# Patient Record
Sex: Female | Born: 1960 | Race: White | Hispanic: No | Marital: Married | Smoking: Former smoker
Health system: Southern US, Community
[De-identification: ages and names within clinical notes are randomized; demographics above are authoritative.]

## PROBLEM LIST (undated history)

## (undated) DIAGNOSIS — F419 Anxiety disorder, unspecified: Secondary | ICD-10-CM

## (undated) DIAGNOSIS — F32A Depression, unspecified: Secondary | ICD-10-CM

## (undated) DIAGNOSIS — F329 Major depressive disorder, single episode, unspecified: Secondary | ICD-10-CM

## (undated) DIAGNOSIS — F319 Bipolar disorder, unspecified: Secondary | ICD-10-CM

## (undated) HISTORY — PX: TONSILLECTOMY: SUR1361

---

## 2007-09-04 ENCOUNTER — Ambulatory Visit (HOSPITAL_COMMUNITY): Admission: RE | Admit: 2007-09-04 | Discharge: 2007-09-04 | Payer: Self-pay | Admitting: Emergency Medicine

## 2009-05-03 ENCOUNTER — Other Ambulatory Visit: Admission: RE | Admit: 2009-05-03 | Discharge: 2009-05-03 | Payer: Self-pay | Admitting: Family Medicine

## 2009-08-19 ENCOUNTER — Encounter: Admission: RE | Admit: 2009-08-19 | Discharge: 2009-08-19 | Payer: Self-pay | Admitting: Family Medicine

## 2010-08-20 ENCOUNTER — Encounter
Admission: RE | Admit: 2010-08-20 | Discharge: 2010-08-20 | Payer: Self-pay | Source: Home / Self Care | Attending: Family Medicine | Admitting: Family Medicine

## 2012-04-18 ENCOUNTER — Other Ambulatory Visit: Payer: Self-pay | Admitting: Family Medicine

## 2012-04-18 DIAGNOSIS — Z1231 Encounter for screening mammogram for malignant neoplasm of breast: Secondary | ICD-10-CM

## 2012-05-02 ENCOUNTER — Ambulatory Visit
Admission: RE | Admit: 2012-05-02 | Discharge: 2012-05-02 | Disposition: A | Discharge status: Home / Self Care | Payer: Managed Care, Other (non HMO) | Source: Ambulatory Visit | Attending: Family Medicine | Admitting: Family Medicine

## 2012-05-02 DIAGNOSIS — Z1231 Encounter for screening mammogram for malignant neoplasm of breast: Secondary | ICD-10-CM

## 2013-05-18 ENCOUNTER — Other Ambulatory Visit: Payer: Self-pay

## 2013-05-18 DIAGNOSIS — Z1231 Encounter for screening mammogram for malignant neoplasm of breast: Secondary | ICD-10-CM

## 2013-06-05 ENCOUNTER — Ambulatory Visit
Admission: RE | Admit: 2013-06-05 | Discharge: 2013-06-05 | Disposition: A | Discharge status: Home / Self Care | Payer: Self-pay | Source: Ambulatory Visit

## 2013-06-05 DIAGNOSIS — Z1231 Encounter for screening mammogram for malignant neoplasm of breast: Secondary | ICD-10-CM

## 2013-08-25 ENCOUNTER — Emergency Department (HOSPITAL_COMMUNITY)
Admission: EM | Admit: 2013-08-25 | Discharge: 2013-08-25 | Disposition: A | Discharge status: Home / Self Care | Payer: BC Managed Care – PPO | Attending: Emergency Medicine | Admitting: Emergency Medicine

## 2013-08-25 ENCOUNTER — Encounter (HOSPITAL_COMMUNITY): Payer: Self-pay | Admitting: Emergency Medicine

## 2013-08-25 ENCOUNTER — Emergency Department (HOSPITAL_COMMUNITY): Payer: BC Managed Care – PPO

## 2013-08-25 DIAGNOSIS — R0789 Other chest pain: Secondary | ICD-10-CM | POA: Insufficient documentation

## 2013-08-25 DIAGNOSIS — R110 Nausea: Secondary | ICD-10-CM | POA: Insufficient documentation

## 2013-08-25 DIAGNOSIS — Z79899 Other long term (current) drug therapy: Secondary | ICD-10-CM | POA: Insufficient documentation

## 2013-08-25 DIAGNOSIS — F319 Bipolar disorder, unspecified: Secondary | ICD-10-CM | POA: Insufficient documentation

## 2013-08-25 DIAGNOSIS — J3489 Other specified disorders of nose and nasal sinuses: Secondary | ICD-10-CM | POA: Insufficient documentation

## 2013-08-25 DIAGNOSIS — R079 Chest pain, unspecified: Secondary | ICD-10-CM

## 2013-08-25 DIAGNOSIS — F411 Generalized anxiety disorder: Secondary | ICD-10-CM | POA: Insufficient documentation

## 2013-08-25 DIAGNOSIS — R1013 Epigastric pain: Secondary | ICD-10-CM | POA: Insufficient documentation

## 2013-08-25 DIAGNOSIS — Z87891 Personal history of nicotine dependence: Secondary | ICD-10-CM | POA: Insufficient documentation

## 2013-08-25 HISTORY — DX: Depression, unspecified: F32.A

## 2013-08-25 HISTORY — DX: Bipolar disorder, unspecified: F31.9

## 2013-08-25 HISTORY — DX: Anxiety disorder, unspecified: F41.9

## 2013-08-25 HISTORY — DX: Major depressive disorder, single episode, unspecified: F32.9

## 2013-08-25 LAB — CBC
HEMATOCRIT: 38 % (ref 36.0–46.0)
HEMOGLOBIN: 12.9 g/dL (ref 12.0–15.0)
MCH: 31.7 pg (ref 26.0–34.0)
MCHC: 33.9 g/dL (ref 30.0–36.0)
MCV: 93.4 fL (ref 78.0–100.0)
Platelets: 254 10*3/uL (ref 150–400)
RBC: 4.07 MIL/uL (ref 3.87–5.11)
RDW: 12.6 % (ref 11.5–15.5)
WBC: 9.5 10*3/uL (ref 4.0–10.5)

## 2013-08-25 LAB — POCT I-STAT TROPONIN I
TROPONIN I, POC: 0.03 ng/mL (ref 0.00–0.08)
Troponin i, poc: 0 ng/mL (ref 0.00–0.08)

## 2013-08-25 LAB — BASIC METABOLIC PANEL
BUN: 12 mg/dL (ref 6–23)
CALCIUM: 9.7 mg/dL (ref 8.4–10.5)
CHLORIDE: 99 meq/L (ref 96–112)
CO2: 31 mEq/L (ref 19–32)
Creatinine, Ser: 0.85 mg/dL (ref 0.50–1.10)
GFR calc non Af Amer: 79 mL/min — ABNORMAL LOW (ref >90)
GLUCOSE: 112 mg/dL — AB (ref 70–99)
Potassium: 4.6 mEq/L (ref 3.7–5.3)
SODIUM: 138 meq/L (ref 137–147)

## 2013-08-25 MED ORDER — HYDROCODONE-ACETAMINOPHEN 5-325 MG PO TABS: ORAL_TABLET | ORAL | Status: DC

## 2013-08-25 MED ORDER — OMEPRAZOLE 20 MG PO CPDR: DELAYED_RELEASE_CAPSULE | ORAL | Status: DC

## 2013-08-25 NOTE — Discharge Instructions (Signed)
Please read and follow all provided instructions.  Your diagnoses today include:  1. Chest pain     Tests performed today include:  An EKG of your heart  A chest x-ray  Cardiac enzymes - a blood test for heart muscle damage  Blood counts and electrolytes  Vital signs. See below for your results today.   Medications prescribed:   Vicodin (hydrocodone/acetaminophen) - narcotic pain medication  DO NOT drive or perform any activities that require you to be awake and alert because this medicine can make you drowsy. BE VERY CAREFUL not to take multiple medicines containing Tylenol (also called acetaminophen). Doing so can lead to an overdose which can damage your liver and cause liver failure and possibly death.   Omeprazole (Prilosec) - stomach acid reducer  This medication can be found over-the-counter  Take any prescribed medications only as directed.  Follow-up instructions: Please follow-up with your primary care provider as soon as you can for further evaluation of your symptoms. If you do not have a primary care doctor -- see below for referral information.   Return instructions:  SEEK IMMEDIATE MEDICAL ATTENTION IF:  You have severe chest pain, especially if the pain is crushing or pressure-like and spreads to the arms, back, neck, or jaw, or if you have sweating, nausea (feeling sick to your stomach), or shortness of breath. THIS IS AN EMERGENCY. Don't wait to see if the pain will go away. Get medical help at once. Call 911 or 0 (operator). DO NOT drive yourself to the hospital.   Your chest pain gets worse and does not go away with rest.   You have an attack of chest pain lasting longer than usual, despite rest and treatment with the medications your caregiver has prescribed.   You wake from sleep with chest pain or shortness of breath.  You feel dizzy or faint.  You have chest pain not typical of your usual pain for which you originally saw your caregiver.   You  have any other emergent concerns regarding your health.  Additional Information: Chest pain comes from many different causes. Your caregiver has diagnosed you as having chest pain that is not specific for one problem, but does not require admission.  You are at low risk for an acute heart condition or other serious illness.   Your vital signs today were: BP 123/65   Pulse 69   Temp(Src) 98.1 F (36.7 C) (Oral)   Resp 15   Ht 5\' 7"  (1.702 m)   Wt 138 lb (62.596 kg)   BMI 21.61 kg/m2   SpO2 98%   LMP 06/25/2013 If your blood pressure (BP) was elevated above 135/85 this visit, please have this repeated by your doctor within one month. --------------  Emergency Department Resource Guide 1) Find a Doctor and Pay Out of Pocket Although you won't have to find out who is covered by your insurance plan, it is a good idea to ask around and get recommendations. You will then need to call the office and see if the doctor you have chosen will accept you as a new patient and what types of options they offer for patients who are self-pay. Some doctors offer discounts or will set up payment plans for their patients who do not have insurance, but you will need to ask so you aren't surprised when you get to your appointment.  2) Contact Your Local Health Department Not all health departments have doctors that can see patients for sick visits, but  many do, so it is worth a call to see if yours does. If you don't know where your local health department is, you can check in your phone book. The CDC also has a tool to help you locate your state's health department, and many state websites also have listings of all of their local health departments.  3) Find a Walk-in Clinic If your illness is not likely to be very severe or complicated, you may want to try a walk in clinic. These are popping up all over the country in pharmacies, drugstores, and shopping centers. They're usually staffed by nurse practitioners or  physician assistants that have been trained to treat common illnesses and complaints. They're usually fairly quick and inexpensive. However, if you have serious medical issues or chronic medical problems, these are probably not your best option.  No Primary Care Doctor: - Call Health Connect at  [phone] - they can help you locate a primary care doctor that  accepts your insurance, provides certain services, etc. - Physician Referral Service- [phone]  Chronic Pain Problems: Organization         Address  Phone   Notes  [facility]  [phone] Patients need to be referred by their primary care doctor.   Medication Assistance: Organization         Address  Phone   Notes  [facility] Medication [facility] [address] [place] [place], [place] [address] [phone] --Must be a resident of [facility] -- Must have NO insurance coverage whatsoever (no Medicaid/ Medicare, etc.) -- The pt. MUST have a primary care doctor that directs their care regularly and follows them in the community   [facility]  [phone]   [employer]  [phone]    Agencies that provide inexpensive medical care: Organization         Address  Phone   Notes  [facility]  [phone]   [facility]    [phone]   [facility] [address] [place], [place] [address] [phone]   [facility] [address] [place] [phone]   Planned Parenthood    [phone]   [facility]    [phone]   [facility]  [address], [place] Phone:  [phone], Fax:  [phone] Hours of Operation:  9 am - 6 pm, M-F.  Also accepts Medicaid/Medicare and self-pay.  [facility]  [address], [place] Phone: [phone], Fax: [phone]. Hours of Operation:  8:30 am - 5:30 pm, M-F.   Also accepts Medicaid and self-pay.  [facility] [place] [address], [place] Phone: [phone]   [facility] [address] Natasha Bence [place], [place] [phone], Ext. [phone] Mondays & Thursdays: 7-9 AM.  First 15 patients are seen on a first come, first serve basis.    Medicaid-accepting [facility] Providers:  Organization         Address  Phone   Notes  [facility] [address], [place] [phone] Also accepts self-pay patients.  [facility] [address] Laurell Josephs [place], [place]  [phone]   [facility] [address], [place] [phone]   [facility] [address] [place] [phone]   Adrian Saran  Bland [address], [place]   [phone] Only accepts [place] patients after they have their name applied to their card.   Self-Pay (no insurance) in [facility]:  Organization         Address  Phone   Notes  Sickle Cell Patients, [facility] Internal Medicine [address] [place], [place] [phone]   [facility] [address] [place], [place] [phone]   [facility] [place]  [address], [place] [phone]   [facility]/Dr. Osei-Bonsu  [address] [place] or [address], [place] [phone] Phone number for both [place] and [place] locations is the same.  [facility] [address] [place] [phone]   [facility] [address] [place] or [address] [phone]   [facility] [address] [place] [phone], phone; [phone], fax Sees patients 1st and 3rd Saturday of every month.  Must not qualify for public or private insurance (i.e. Medicaid, Medicare, [place] Health Choice, Veterans'  Benefits)  Household income should be no more than 200% of the poverty level The clinic cannot treat you if you are pregnant or think you are pregnant  Sexually transmitted diseases are not treated at the clinic.    Dental Care: Organization         Address  Phone  Notes  [facility] Department of [facility] [address] [place], [place] [phone] Accepts children up to age 4 who are enrolled in [place] or [place] Health Choice; pregnant women with a Medicaid card; and children who have applied for Medicaid or [place] Health Choice, but were declined, whose parents can pay a reduced fee at time of service.  [facility] Department of [facility]  [address], [place] [phone] Accepts children up to age 43 who are enrolled in [place] or [place] Health Choice; pregnant women with a Medicaid card; and children who have applied for Medicaid or [place] Health Choice, but were declined, whose parents can pay a reduced fee at time of service.  [facility]  [address] [place], [place] [phone] Patients are seen by appointment only. Walk-ins are not accepted. [facility] will see patients 81 years of age and older. Monday - Tuesday (8am-5pm) Most Wednesdays (8:30-5pm) $30 per visit, cash only  [facility] Adult Dental Access [facility]  [address], [facility] [phone] Patients are seen by appointment only. Walk-ins are not accepted. [facility] will see patients 85 years of age and older. One Wednesday Evening (Monthly: Volunteer Based).  $30 per visit, cash only  [employer]  [phone] for adults; Children under age 44, call Graduate Pediatric Dentistry at [phone]. Children aged 78-14, please call [phone] to request a pediatric application.  Dental services are provided in all areas of dental care including fillings, crowns and bridges, complete and partial  dentures, implants, gum treatment, root canals, and extractions. Preventive care is also provided. Treatment is provided to both adults and children. Patients are selected via a lottery and there is often a waiting list.   [facility] [address] [place]  [phone] [email or web address]   [facility] [address] [place], [place] [phone], Ext. [phone]  Second and Fourth Thursday of each month, opens at 6:30 AM; Clinic ends at 9 AM.  Patients are seen on a first-come first-served basis, and a limited number are seen during each clinic.   [facility]  [address], [place], [place] [phone]   Eligibility Requirements You must have lived in [place], [place], or [place] counties for at least the last three months.   You cannot be eligible for state or federal sponsored [place] Cityhealthcare insurance, including [employer]IG[employer]eterans Administration, [place]icaid, or [employer]icare.   You generally cannot be eligible for healthcare insurance through your employer.    How to apply: Eligibility screenings are held every Tuesday and Wednesday afternoon from 1:00 pm until 4:00 pm. You do not need an appointment for the interview!  [facility] [address], [place], [place] [phone]   [facility]  [phone]   [facility]  [phone]   [facility]  [phone]    Behavioral Health Resources in the Community: Intensive Outpatient Programs Organization         Address  Phone  Notes  [facility] [address], [place], [place] [phone]   [facility] [address], [place], [place] [phone]   [facility] [address], [place], [place]  [phone]   [facility] [address],  [place], [place] [phone] or [phone]   Substance Abuse Resources Organization          Address  Phone  Notes  [facility]  [phone]   [facility]  [phone]   [facility]  [phone]   [facility]  [phone]   Residential & Outpatient Substance Abuse Program  [phone]   Psychological Services Organization         Address  Phone  Notes  [facility]  [phone]- [phone]   [facility]  [phone]- [phone]   [facility] [address], [place] ([phone] or [phone]    Mobile Crisis Teams Organization         Address  Phone  Notes  [facility], Mobile Crisis Care Unit  [phone]   [facility]  [address] [place], [place] [phone]   Doristine LocksSharon DeEsch [address] [place] [phone]    Self-Help/Support Groups Organization         Address  Phone             Notes  [facility] [place] - variety of support groups  336- [phone] Call for more information  Narcotics Anonymous (NA), [facility] [address], [place]  2 meetings at this location   Statisticianesidential Treatment Programs Organization         Address  Phone  Notes  [facility] [address],    [place]  [phone]   [facility]  [address], [place] [phone], [place], [place] [phone]   [facility] [address], [place] [phone] Admissions: 8am-3pm M-F  [facility] [address]    [place], [place] [phone]   [facility] [address], [place], [place] [phone]   [facility] [address]  [place], [place] [phone]   [facility] - Intensive Outpatient [address], [place], [place] [phone]   [facility] ([facility] [phone]  [employer]  [place], [place] [phone] or [phone]   [facility] ([facility]) [address] [place], [place]  [phone] Accepts Medicaid  [facility] [address],  [place] [phone] Substance Abuse/Addiction Treatment   [facility] Organization         Address  Phone  Notes  [facility]  [phone]   Angie Fava, PhD [address] Ervin Knack [place], [place]   [phone] or [phone]   [facility]   [address] [place], [place] [phone]   [facility] [address] [place], [place] [phone] Insurance/Medicaid/sponsorship through [facility] and Families [address]                                    [place], [place] [phone] Therapy/tele-psych/case  [facility] [address], [place] [phone]    Dr. Lolly Mustache  [phone]   [facility]  [employer] [facility] Dept. 1) [address] [place] 2) [address], [place] 3)  [address], [place] [phone]  [phone]   [facility] Child Abuse Hotline 718-681-6943 or [phone] (After Hours)

## 2013-08-25 NOTE — ED Notes (Signed)
Per EMS - pt had onset of CP at 7am while on the way to work. Had nausea and lightheadedness along with it. Pt took tums, didn't feel better. Upon ems arrival, still felt lightheaded. EMS administered 324 ASA, 4 MG of Zofran, and 1 Nitro. Pt had relief with meds. 20 G in left arm. Initial BP 158/92, after nitro 138/78. Hx of bipolar, anxiety and depession, reports she does take meds for them. HR 78-80s. 98% on room air. RR 22. Takes Lamictal, valium and Lithium. Allergies to codeine. 12 lead unremarkable.

## 2013-08-25 NOTE — ED Notes (Signed)
Phlebotomy at bedside.

## 2013-08-25 NOTE — ED Notes (Signed)
Pt reports she was on her way to work when she started to have some sub-sternal CP, the pain increasingly became worse, started to eat then while at work the pain became worse. Felt like she was going to pass out and became nauseous. Pt reports she became very weak and lightheaded so she called EMS.

## 2013-08-25 NOTE — ED Notes (Signed)
Pt returned from radiology.

## 2013-08-25 NOTE — ED Provider Notes (Signed)
CSN: [id number]     Arrival date & time 08/25/13  0850 History   First MD Initiated Contact with Patient 08/25/13 [phone]     Chief Complaint  Patient presents with  . Chest Pain   (Consider location/radiation/quality/duration/timing/severity/associated sxs/prior Treatment) HPI Comments: Patient with history of bipolar disorder presents with complaint of lower chest and epigastric pain which began gradually approximately 7 AM and lasted for about an hour and half. Patient gradually became more severe. She describes it as being "punched in the chest". She did not have any radiation of pain, associated shortness of breath, diaphoresis. She had nausea but no vomiting. She took comments which helped her pain. She was given nitroglycerin, Zofran and aspirin by EMS which also helped. Pain is mild currently. Patient denies history of hypertension, diabetes, hypercholesterolemia, smoking, family history of CAD. She does not take aspirin.  Patient is a 53 y.o. female presenting with chest pain. The history is provided by the patient.  Chest Pain Associated symptoms: abdominal pain   Associated symptoms: no back pain, no cough, no diaphoresis, no fever, no nausea, no palpitations, no shortness of breath and not vomiting     Past Medical History  Diagnosis Date  . Bipolar 1 disorder   . Anxiety   . Depression    Past Surgical History  Procedure Laterality Date  . Tonsillectomy     No family history on file. History  Substance Use Topics  . Smoking status: Former Games developermoker  . Smokeless tobacco: Not on file  . Alcohol Use: Yes     Comment: 4-5 drink about 4-5 times a week   OB History   Grav Para Term Preterm Abortions TAB SAB Ect Mult Living                 Review of Systems  Constitutional: Negative for fever and diaphoresis.  HENT: Positive for congestion and sinus pressure.   Eyes: Negative for redness.  Respiratory: Negative for cough and shortness of breath.   Cardiovascular: Positive  for chest pain. Negative for palpitations and leg swelling.  Gastrointestinal: Positive for abdominal pain. Negative for nausea and vomiting.  Genitourinary: Negative for dysuria.  Musculoskeletal: Negative for back pain and neck pain.  Skin: Negative for rash.  Neurological: Negative for syncope and light-headedness.    Allergies  Codeine  Home Medications   Current Outpatient Rx  Name  Route  Sig  Dispense  Refill  . lamoTRIgine (LAMICTAL) 150 MG tablet   Oral   Take 150 mg by mouth 2 (two) times daily.         Marland Kitchen. lithium carbonate 150 MG capsule   Oral   Take 150 mg by mouth once.          BP 121/77  Pulse 72  Temp(Src) 98.1 F (36.7 C) (Oral)  Resp 12  Ht 5\' 7"  (1.702 m)  Wt 138 lb (62.596 kg)  BMI 21.61 kg/m2  SpO2 98%  LMP 06/25/2013 Physical Exam  Nursing note and vitals reviewed. Constitutional: She appears well-developed and well-nourished.  HENT:  Head: Normocephalic and atraumatic.  Mouth/Throat: Mucous membranes are normal. Mucous membranes are not dry.  Eyes: Conjunctivae are normal.  Neck: Trachea normal and normal range of motion. Neck supple. Normal carotid pulses and no JVD present. No muscular tenderness present. Carotid bruit is not present. No tracheal deviation present.  Cardiovascular: Normal rate, regular rhythm, S1 normal, S2 normal, normal heart sounds and intact distal pulses.  Exam reveals no decreased pulses.  No murmur heard. Pulmonary/Chest: Effort normal. No respiratory distress. She has no wheezes. She exhibits no tenderness.  Abdominal: Soft. Normal aorta and bowel sounds are normal. There is tenderness. There is no rebound and no guarding.    Musculoskeletal: Normal range of motion.  Neurological: She is alert.  Skin: Skin is warm and dry. She is not diaphoretic. No cyanosis. No pallor.  Psychiatric: She has a normal mood and affect.    ED Course  Procedures (including critical care time) Labs Review Labs Reviewed  BASIC  METABOLIC PANEL - Abnormal; Notable for the following:    Glucose, Bld 112 (*)    GFR calc non Af Amer 79 (*)    All other components within normal limits  CBC  POCT I-STAT TROPONIN I  POCT I-STAT TROPONIN I   Imaging Review Dg Chest 2 View  08/25/2013   CLINICAL DATA:  Chest pain.  EXAM: CHEST  2 VIEW  COMPARISON:  None.  FINDINGS: There is hyperinflation of the lungs compatible with COPD. Biapical scarring. Lungs otherwise clear. No effusions. Heart is normal size. No acute bony abnormality.  IMPRESSION: COPD/chronic changes.  No active disease.   Electronically Signed   By: Charlett Nose M.D.   On: 08/25/2013 09:34    EKG Interpretation    Date/Time:  Friday August 25 2013 09:02:29 EST Ventricular Rate:  70 PR Interval:  169 QRS Duration: 84 QT Interval:  393 QTC Calculation: 424 R Axis:   59 Text Interpretation:  Normal sinus rhythm No acute ischemia No old tracing to compare Confirmed by GOLDSTON  MD, SCOTT ([id number]) on 08/25/2013 9:07:26 AM           9:16 AM Patient seen and examined. Work-up initiated. Medications ordered. EKG reviewed.   Vital signs reviewed and are as follows: Filed Vitals:   08/25/13 0904  BP: 121/77  Pulse: 72  Temp: 98.1 F (36.7 C)  Resp: 12   12:36 PM Patient was discussed with Dr. Criss Alvine who has seen. Work-up neg including delta troponin. Doubt ACS. Feel this is more likely GI origin. Will give pain medication and start PPI.   Patient counseled on use of narcotic pain medications. Counseled not to combine these medications with others containing tylenol. Urged not to drink alcohol, drive, or perform any other activities that requires focus while taking these medications. The patient verbalizes understanding and agrees with the plan.  Patient was counseled to return with severe chest pain, especially if the pain is crushing or pressure-like and spreads to the arms, back, neck, or jaw, or if they have sweating, nausea, or shortness of breath  with the pain. They were encouraged to call 911 with these symptoms.   They were also told to return if their chest pain gets worse and does not go away with rest, they have an attack of chest pain lasting longer than usual despite rest and treatment with the medications their caregiver has prescribed, if they wake from sleep with chest pain or shortness of breath, if they feel dizzy or faint, if they have chest pain not typical of their usual pain, or if they have any other emergent concerns regarding their health.  The patient verbalized understanding and agreed.    MDM   1. Chest pain    Patient with chest tightness.  Doubt cardiac origin of pain given: atypical pain, neg cardiac enzymes, normal EKG, no exertional component, lack of accompanying sx including palps, sweating, SOB, radiation.   Doubt PE, infection. Symptoms  most consistent with esophageal spasm. No free air on CXR to suggest perforated viscus and exam does not suggest this either.      Renne Crigler, PA-C 08/25/13 1238

## 2013-08-28 NOTE — ED Provider Notes (Signed)
Medical screening examination/treatment/procedure(s) were conducted as a shared visit with non-physician practitioner(s) and myself.  I personally evaluated the patient during the encounter.  EKG Interpretation    Date/Time:  Friday August 25 2013 09:02:29 EST Ventricular Rate:  70 PR Interval:  169 QRS Duration: 84 QT Interval:  393 QTC Calculation: 424 R Axis:   59 Text Interpretation:  Normal sinus rhythm No acute ischemia No old tracing to compare Confirmed by Ranata Laughery  MD, Pachia Strum ([id number]) on 08/25/2013 9:07:26 AM            patient with atypical chest pain. Low risk for ACS. Not c/w ACS, PE or dissection. Appears more GI with epigastric tenderness. Will treat symptomatically after rule out and f/u with PCP.  Audree CamelScott T Lorrayne Ismael, MD 08/28/13 [phone]

## 2013-08-29 ENCOUNTER — Other Ambulatory Visit: Payer: Self-pay | Admitting: Gastroenterology

## 2013-08-29 DIAGNOSIS — R1013 Epigastric pain: Secondary | ICD-10-CM

## 2013-09-04 ENCOUNTER — Other Ambulatory Visit: Payer: Managed Care, Other (non HMO)

## 2014-06-26 ENCOUNTER — Other Ambulatory Visit: Payer: Self-pay

## 2014-06-26 DIAGNOSIS — Z1231 Encounter for screening mammogram for malignant neoplasm of breast: Secondary | ICD-10-CM

## 2014-07-05 ENCOUNTER — Other Ambulatory Visit: Payer: Self-pay

## 2014-07-05 ENCOUNTER — Ambulatory Visit
Admission: RE | Admit: 2014-07-05 | Discharge: 2014-07-05 | Disposition: A | Discharge status: Home / Self Care | Payer: BC Managed Care – PPO | Source: Ambulatory Visit

## 2014-07-05 DIAGNOSIS — Z1231 Encounter for screening mammogram for malignant neoplasm of breast: Secondary | ICD-10-CM

## 2015-07-30 ENCOUNTER — Other Ambulatory Visit: Payer: Self-pay

## 2015-07-30 DIAGNOSIS — Z1231 Encounter for screening mammogram for malignant neoplasm of breast: Secondary | ICD-10-CM

## 2015-08-21 ENCOUNTER — Ambulatory Visit
Admission: RE | Admit: 2015-08-21 | Discharge: 2015-08-21 | Disposition: A | Discharge status: Home / Self Care | Payer: BLUE CROSS/BLUE SHIELD | Source: Ambulatory Visit

## 2015-08-21 DIAGNOSIS — Z1231 Encounter for screening mammogram for malignant neoplasm of breast: Secondary | ICD-10-CM

## 2015-11-21 ENCOUNTER — Encounter (HOSPITAL_BASED_OUTPATIENT_CLINIC_OR_DEPARTMENT_OTHER): Payer: Self-pay

## 2015-11-21 ENCOUNTER — Emergency Department (HOSPITAL_BASED_OUTPATIENT_CLINIC_OR_DEPARTMENT_OTHER)
Admission: EM | Admit: 2015-11-21 | Discharge: 2015-11-21 | Disposition: A | Discharge status: Home / Self Care | Payer: BLUE CROSS/BLUE SHIELD | Attending: Emergency Medicine | Admitting: Emergency Medicine

## 2015-11-21 DIAGNOSIS — Z87891 Personal history of nicotine dependence: Secondary | ICD-10-CM | POA: Insufficient documentation

## 2015-11-21 DIAGNOSIS — M25562 Pain in left knee: Secondary | ICD-10-CM | POA: Diagnosis not present

## 2015-11-21 DIAGNOSIS — R2242 Localized swelling, mass and lump, left lower limb: Secondary | ICD-10-CM | POA: Diagnosis present

## 2015-11-21 DIAGNOSIS — F319 Bipolar disorder, unspecified: Secondary | ICD-10-CM | POA: Insufficient documentation

## 2015-11-21 DIAGNOSIS — Z79899 Other long term (current) drug therapy: Secondary | ICD-10-CM | POA: Insufficient documentation

## 2015-11-21 DIAGNOSIS — F419 Anxiety disorder, unspecified: Secondary | ICD-10-CM | POA: Diagnosis not present

## 2015-11-21 NOTE — ED Notes (Signed)
Left knee with fluid x 3 weeks ago-now having swelling to left ankle x 2-3 days-NAD-steady gait-refused need for w/c

## 2015-11-21 NOTE — Discharge Instructions (Signed)
You likely have left knee pain secondary to overuse injury. If your pain gets worse, he develop left calf pain and swelling, you develop weakness, difficulty walking on her left leg return to emergency department for evaluation. Please follow with PCP as needed.

## 2015-11-21 NOTE — ED Provider Notes (Signed)
CSN: [id number]     Arrival date & time 11/21/15  1652 History   First MD Initiated Contact with Patient 11/21/15 1721     Chief Complaint  Patient presents with  . Leg Swelling      HPI  55 year old female presenting for left knee pain and left  ankle swelling. Patient went bowling on March 11 and the following day she noted left knee pain and swelling. Since then her left knee pain and swelling have largely resolved however she occasionally notices knee sharp pain with ambulation. The pain is located on medial and left lateral aspects of her knee and lasts for a few seconds then resolves without treatment.  she noticed left ankle swelling started 3 days ago minimally relieved with leg elevation. She is seen by her PCP for knee pain approximately 1 week ago and was referred to orthopedics. She has appointment in mid April with orthopedics scheduled. Patient denies difficulty walking, calf pain or calf swelling, knee erythema or tenderness to touch.   At this time patient denies any pain in her knee or swelling. She presents emergency department because a family member with concern that she may have blood clot in her leg. She denies hormonal therapy, recent cancer diagnosis, prolonged immobilization, recent travel, any history of clots.   Otherwise she denies recent illnesses, fever, nausea, vomiting, diarrhea, headache, shortness of breath, chest pain   Past Medical History  Diagnosis Date  . Bipolar 1 disorder (HCC)   . Anxiety   . Depression    Past Surgical History  Procedure Laterality Date  . Tonsillectomy     No family history on file. Social History  Substance Use Topics  . Smoking status: Former Games developermoker  . Smokeless tobacco: None  . Alcohol Use: Yes     Comment: weekly   OB History    No data available     Review of Systems  Constitutional: Negative.   HENT: Negative.   Eyes: Negative.   Respiratory: Negative.   Cardiovascular: Negative.   Gastrointestinal:  Negative.   Genitourinary: Negative.   Musculoskeletal: Positive for joint swelling.  Skin: Negative.     Allergies  Codeine  Home Medications   Prior to Admission medications   Medication Sig Start Date End Date Taking? Authorizing Provider  Lurasidone HCl (LATUDA PO) Take by mouth.   Yes Historical Provider, MD  lamoTRIgine (LAMICTAL) 150 MG tablet Take 150 mg by mouth 2 (two) times daily.    Historical Provider, MD  lithium carbonate 150 MG capsule Take 150 mg by mouth once.    Historical Provider, MD   BP 134/83 mmHg  Pulse 67  Temp(Src) 98.3 F (36.8 C) (Oral)  Resp 20  Ht 5\' 7"  (1.702 m)  Wt 68.04 kg  BMI 23.49 kg/m2  SpO2 99% Physical Exam  Constitutional: She is oriented to person, place, and time. She appears well-developed and well-nourished.  HENT:  Head: Normocephalic and atraumatic.  Eyes: Conjunctivae and EOM are normal. Pupils are equal, round, and reactive to light.  Neck: Normal range of motion.  Cardiovascular: Normal rate, regular rhythm, normal heart sounds and intact distal pulses.   Pulmonary/Chest: Effort normal and breath sounds normal.  Abdominal: Soft. Bowel sounds are normal. She exhibits no distension. There is no tenderness.  Musculoskeletal: Normal range of motion.  Normal bilateral knees without swelling, erythema, tenderness. Mild left ankle swelling, nonerythematous, nontender  Neurological: She is oriented to person, place, and time.  Skin: Skin is warm and dry.  ED Course  Procedures (including critical care time) Labs Review Labs Reviewed - No data to display  I  MDM   Final diagnoses:  Left knee pain    55 year old female presenting for left knee pain and swelling left ankle swelling. Symptoms have largely resolved and are likely secondary to an overuse injury. Wells DVT score 0, history and physical exam inconsistent with DVT. 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 Patient will be discharged with close follow-up with PCP, counseled her orthopedics follow-up appointment and given strict ED return precautions. All questions were answered to patient's satisfaction.   Bluma Buresh A. Kennon Rounds MD, MS Family Medicine Resident PGY-2 Pager [phone]     Bonney Aid, MD 11/21/15 1478  Cathren Laine, MD 11/23/15 [phone]

## 2016-03-27 DIAGNOSIS — F422 Mixed obsessional thoughts and acts: Secondary | ICD-10-CM | POA: Diagnosis not present

## 2016-03-27 DIAGNOSIS — F3176 Bipolar disorder, in full remission, most recent episode depressed: Secondary | ICD-10-CM | POA: Diagnosis not present

## 2016-03-27 DIAGNOSIS — F3174 Bipolar disorder, in full remission, most recent episode manic: Secondary | ICD-10-CM | POA: Diagnosis not present

## 2016-05-15 DIAGNOSIS — F3176 Bipolar disorder, in full remission, most recent episode depressed: Secondary | ICD-10-CM | POA: Diagnosis not present

## 2016-06-12 DIAGNOSIS — F411 Generalized anxiety disorder: Secondary | ICD-10-CM | POA: Diagnosis not present

## 2016-06-12 DIAGNOSIS — F3181 Bipolar II disorder: Secondary | ICD-10-CM | POA: Diagnosis not present

## 2016-06-24 DIAGNOSIS — F411 Generalized anxiety disorder: Secondary | ICD-10-CM | POA: Diagnosis not present

## 2016-06-24 DIAGNOSIS — F3181 Bipolar II disorder: Secondary | ICD-10-CM | POA: Diagnosis not present

## 2016-06-26 DIAGNOSIS — H16041 Marginal corneal ulcer, right eye: Secondary | ICD-10-CM | POA: Diagnosis not present

## 2016-06-30 DIAGNOSIS — F3181 Bipolar II disorder: Secondary | ICD-10-CM | POA: Diagnosis not present

## 2016-06-30 DIAGNOSIS — F411 Generalized anxiety disorder: Secondary | ICD-10-CM | POA: Diagnosis not present

## 2016-08-12 ENCOUNTER — Other Ambulatory Visit: Payer: Self-pay | Admitting: Family Medicine

## 2016-08-12 DIAGNOSIS — Z1231 Encounter for screening mammogram for malignant neoplasm of breast: Secondary | ICD-10-CM

## 2016-08-13 DIAGNOSIS — F411 Generalized anxiety disorder: Secondary | ICD-10-CM | POA: Diagnosis not present

## 2016-08-13 DIAGNOSIS — F3181 Bipolar II disorder: Secondary | ICD-10-CM | POA: Diagnosis not present

## 2016-09-03 ENCOUNTER — Ambulatory Visit: Payer: BLUE CROSS/BLUE SHIELD

## 2016-09-16 ENCOUNTER — Ambulatory Visit
Admission: RE | Admit: 2016-09-16 | Discharge: 2016-09-16 | Disposition: A | Discharge status: Home / Self Care | Payer: BLUE CROSS/BLUE SHIELD | Source: Ambulatory Visit | Attending: Family Medicine | Admitting: Family Medicine

## 2016-09-16 DIAGNOSIS — Z1231 Encounter for screening mammogram for malignant neoplasm of breast: Secondary | ICD-10-CM

## 2016-09-18 DIAGNOSIS — F3176 Bipolar disorder, in full remission, most recent episode depressed: Secondary | ICD-10-CM | POA: Diagnosis not present

## 2016-09-18 DIAGNOSIS — F3174 Bipolar disorder, in full remission, most recent episode manic: Secondary | ICD-10-CM | POA: Diagnosis not present

## 2016-12-30 DIAGNOSIS — Z0000 Encounter for general adult medical examination without abnormal findings: Secondary | ICD-10-CM | POA: Diagnosis not present

## 2016-12-30 DIAGNOSIS — Z124 Encounter for screening for malignant neoplasm of cervix: Secondary | ICD-10-CM | POA: Diagnosis not present

## 2016-12-30 DIAGNOSIS — Z131 Encounter for screening for diabetes mellitus: Secondary | ICD-10-CM | POA: Diagnosis not present

## 2016-12-30 DIAGNOSIS — Z130 Encounter for screening for diseases of the blood and blood-forming organs and certain disorders involving the immune mechanism: Secondary | ICD-10-CM | POA: Diagnosis not present

## 2016-12-30 DIAGNOSIS — Z13220 Encounter for screening for lipoid disorders: Secondary | ICD-10-CM | POA: Diagnosis not present

## 2017-01-25 DIAGNOSIS — L03031 Cellulitis of right toe: Secondary | ICD-10-CM | POA: Diagnosis not present

## 2017-01-25 DIAGNOSIS — L02611 Cutaneous abscess of right foot: Secondary | ICD-10-CM | POA: Diagnosis not present

## 2017-01-25 DIAGNOSIS — R238 Other skin changes: Secondary | ICD-10-CM | POA: Diagnosis not present

## 2017-01-25 DIAGNOSIS — M79674 Pain in right toe(s): Secondary | ICD-10-CM | POA: Diagnosis not present

## 2017-01-25 DIAGNOSIS — M21612 Bunion of left foot: Secondary | ICD-10-CM | POA: Diagnosis not present

## 2017-02-09 DIAGNOSIS — L03031 Cellulitis of right toe: Secondary | ICD-10-CM | POA: Diagnosis not present

## 2017-09-17 ENCOUNTER — Other Ambulatory Visit: Payer: Self-pay | Admitting: Family Medicine

## 2017-09-17 DIAGNOSIS — Z139 Encounter for screening, unspecified: Secondary | ICD-10-CM

## 2017-10-11 ENCOUNTER — Encounter: Payer: Self-pay | Admitting: Radiology

## 2017-10-11 ENCOUNTER — Ambulatory Visit
Admission: RE | Admit: 2017-10-11 | Discharge: 2017-10-11 | Disposition: A | Discharge status: Home / Self Care | Payer: PRIVATE HEALTH INSURANCE | Source: Ambulatory Visit | Attending: Family Medicine | Admitting: Family Medicine

## 2017-10-11 DIAGNOSIS — Z139 Encounter for screening, unspecified: Secondary | ICD-10-CM

## 2018-01-04 DIAGNOSIS — M7752 Other enthesopathy of left foot: Secondary | ICD-10-CM | POA: Diagnosis not present

## 2018-01-04 DIAGNOSIS — M21612 Bunion of left foot: Secondary | ICD-10-CM | POA: Diagnosis not present

## 2018-01-04 DIAGNOSIS — M21961 Unspecified acquired deformity of right lower leg: Secondary | ICD-10-CM | POA: Diagnosis not present

## 2018-01-04 DIAGNOSIS — M21962 Unspecified acquired deformity of left lower leg: Secondary | ICD-10-CM | POA: Diagnosis not present

## 2018-01-04 DIAGNOSIS — M79672 Pain in left foot: Secondary | ICD-10-CM | POA: Diagnosis not present

## 2018-01-18 DIAGNOSIS — R51 Headache: Secondary | ICD-10-CM | POA: Diagnosis not present

## 2018-01-21 DIAGNOSIS — M21962 Unspecified acquired deformity of left lower leg: Secondary | ICD-10-CM | POA: Diagnosis not present

## 2018-01-21 DIAGNOSIS — M7752 Other enthesopathy of left foot: Secondary | ICD-10-CM | POA: Diagnosis not present

## 2018-01-21 DIAGNOSIS — M21612 Bunion of left foot: Secondary | ICD-10-CM | POA: Diagnosis not present

## 2018-01-21 DIAGNOSIS — M21961 Unspecified acquired deformity of right lower leg: Secondary | ICD-10-CM | POA: Diagnosis not present

## 2018-02-11 DIAGNOSIS — F3131 Bipolar disorder, current episode depressed, mild: Secondary | ICD-10-CM | POA: Diagnosis not present

## 2018-02-16 DIAGNOSIS — M2012 Hallux valgus (acquired), left foot: Secondary | ICD-10-CM | POA: Diagnosis not present

## 2018-02-16 DIAGNOSIS — M7742 Metatarsalgia, left foot: Secondary | ICD-10-CM | POA: Diagnosis not present

## 2018-02-16 DIAGNOSIS — M79672 Pain in left foot: Secondary | ICD-10-CM | POA: Diagnosis not present

## 2018-02-16 DIAGNOSIS — M2042 Other hammer toe(s) (acquired), left foot: Secondary | ICD-10-CM | POA: Diagnosis not present

## 2018-03-25 DIAGNOSIS — F3132 Bipolar disorder, current episode depressed, moderate: Secondary | ICD-10-CM | POA: Diagnosis not present

## 2018-03-25 DIAGNOSIS — F3174 Bipolar disorder, in full remission, most recent episode manic: Secondary | ICD-10-CM | POA: Diagnosis not present

## 2018-04-01 DIAGNOSIS — M7742 Metatarsalgia, left foot: Secondary | ICD-10-CM | POA: Diagnosis not present

## 2018-04-01 DIAGNOSIS — M79672 Pain in left foot: Secondary | ICD-10-CM | POA: Diagnosis not present

## 2018-04-01 DIAGNOSIS — M2012 Hallux valgus (acquired), left foot: Secondary | ICD-10-CM | POA: Diagnosis not present

## 2018-04-01 DIAGNOSIS — M2042 Other hammer toe(s) (acquired), left foot: Secondary | ICD-10-CM | POA: Diagnosis not present

## 2018-05-10 DIAGNOSIS — M2042 Other hammer toe(s) (acquired), left foot: Secondary | ICD-10-CM | POA: Diagnosis not present

## 2018-05-10 DIAGNOSIS — M7742 Metatarsalgia, left foot: Secondary | ICD-10-CM | POA: Diagnosis not present

## 2018-05-10 DIAGNOSIS — M21612 Bunion of left foot: Secondary | ICD-10-CM | POA: Diagnosis not present

## 2018-05-10 DIAGNOSIS — G8918 Other acute postprocedural pain: Secondary | ICD-10-CM | POA: Diagnosis not present

## 2018-05-13 DIAGNOSIS — F3131 Bipolar disorder, current episode depressed, mild: Secondary | ICD-10-CM | POA: Diagnosis not present

## 2018-05-13 DIAGNOSIS — F3111 Bipolar disorder, current episode manic without psychotic features, mild: Secondary | ICD-10-CM | POA: Diagnosis not present

## 2018-05-18 DIAGNOSIS — M62830 Muscle spasm of back: Secondary | ICD-10-CM | POA: Diagnosis not present

## 2018-05-18 DIAGNOSIS — M545 Low back pain: Secondary | ICD-10-CM | POA: Diagnosis not present

## 2018-05-25 DIAGNOSIS — M2012 Hallux valgus (acquired), left foot: Secondary | ICD-10-CM | POA: Diagnosis not present

## 2018-05-31 DIAGNOSIS — M545 Low back pain: Secondary | ICD-10-CM | POA: Diagnosis not present

## 2018-05-31 DIAGNOSIS — M4856XA Collapsed vertebra, not elsewhere classified, lumbar region, initial encounter for fracture: Secondary | ICD-10-CM | POA: Diagnosis not present

## 2018-05-31 DIAGNOSIS — M8580 Other specified disorders of bone density and structure, unspecified site: Secondary | ICD-10-CM | POA: Diagnosis not present

## 2018-06-01 ENCOUNTER — Other Ambulatory Visit: Payer: Self-pay | Admitting: Family Medicine

## 2018-06-01 DIAGNOSIS — E2839 Other primary ovarian failure: Secondary | ICD-10-CM

## 2018-06-03 DIAGNOSIS — M545 Low back pain: Secondary | ICD-10-CM | POA: Diagnosis not present

## 2018-06-03 DIAGNOSIS — R350 Frequency of micturition: Secondary | ICD-10-CM | POA: Diagnosis not present

## 2018-06-10 DIAGNOSIS — M545 Low back pain: Secondary | ICD-10-CM | POA: Diagnosis not present

## 2018-06-21 DIAGNOSIS — M545 Low back pain: Secondary | ICD-10-CM | POA: Diagnosis not present

## 2018-06-21 DIAGNOSIS — M810 Age-related osteoporosis without current pathological fracture: Secondary | ICD-10-CM | POA: Diagnosis not present

## 2018-06-21 DIAGNOSIS — M4856XA Collapsed vertebra, not elsewhere classified, lumbar region, initial encounter for fracture: Secondary | ICD-10-CM | POA: Diagnosis not present

## 2018-06-27 DIAGNOSIS — M2012 Hallux valgus (acquired), left foot: Secondary | ICD-10-CM | POA: Diagnosis not present

## 2018-06-27 DIAGNOSIS — Z5189 Encounter for other specified aftercare: Secondary | ICD-10-CM | POA: Diagnosis not present

## 2018-07-25 DIAGNOSIS — M79672 Pain in left foot: Secondary | ICD-10-CM | POA: Diagnosis not present

## 2018-08-02 DIAGNOSIS — Z124 Encounter for screening for malignant neoplasm of cervix: Secondary | ICD-10-CM | POA: Diagnosis not present

## 2018-08-02 DIAGNOSIS — Z6826 Body mass index (BMI) 26.0-26.9, adult: Secondary | ICD-10-CM | POA: Diagnosis not present

## 2018-08-02 DIAGNOSIS — Z01419 Encounter for gynecological examination (general) (routine) without abnormal findings: Secondary | ICD-10-CM | POA: Diagnosis not present

## 2018-08-05 ENCOUNTER — Other Ambulatory Visit: Payer: Self-pay | Admitting: Obstetrics and Gynecology

## 2018-08-05 ENCOUNTER — Ambulatory Visit
Admission: RE | Admit: 2018-08-05 | Discharge: 2018-08-05 | Disposition: A | Discharge status: Home / Self Care | Payer: BLUE CROSS/BLUE SHIELD | Source: Ambulatory Visit | Attending: Family Medicine | Admitting: Family Medicine

## 2018-08-05 DIAGNOSIS — M8589 Other specified disorders of bone density and structure, multiple sites: Secondary | ICD-10-CM | POA: Diagnosis not present

## 2018-08-05 DIAGNOSIS — E2839 Other primary ovarian failure: Secondary | ICD-10-CM

## 2018-08-05 DIAGNOSIS — Z780 Asymptomatic menopausal state: Secondary | ICD-10-CM | POA: Diagnosis not present

## 2018-08-09 DIAGNOSIS — F3174 Bipolar disorder, in full remission, most recent episode manic: Secondary | ICD-10-CM | POA: Diagnosis not present

## 2018-08-09 DIAGNOSIS — F3176 Bipolar disorder, in full remission, most recent episode depressed: Secondary | ICD-10-CM | POA: Diagnosis not present

## 2018-08-25 DIAGNOSIS — M79672 Pain in left foot: Secondary | ICD-10-CM | POA: Diagnosis not present

## 2018-09-12 DIAGNOSIS — Z6826 Body mass index (BMI) 26.0-26.9, adult: Secondary | ICD-10-CM | POA: Diagnosis not present

## 2018-09-12 DIAGNOSIS — N941 Unspecified dyspareunia: Secondary | ICD-10-CM | POA: Diagnosis not present

## 2018-09-26 DIAGNOSIS — M158 Other polyosteoarthritis: Secondary | ICD-10-CM | POA: Diagnosis not present

## 2018-09-26 DIAGNOSIS — F3131 Bipolar disorder, current episode depressed, mild: Secondary | ICD-10-CM | POA: Diagnosis not present

## 2018-09-26 DIAGNOSIS — M8580 Other specified disorders of bone density and structure, unspecified site: Secondary | ICD-10-CM | POA: Diagnosis not present

## 2019-01-03 ENCOUNTER — Ambulatory Visit (HOSPITAL_BASED_OUTPATIENT_CLINIC_OR_DEPARTMENT_OTHER)
Admission: RE | Admit: 2019-01-03 | Discharge: 2019-01-03 | Disposition: A | Discharge status: Home / Self Care | Payer: BLUE CROSS/BLUE SHIELD | Source: Ambulatory Visit | Attending: Family Medicine | Admitting: Family Medicine

## 2019-01-03 ENCOUNTER — Other Ambulatory Visit (HOSPITAL_BASED_OUTPATIENT_CLINIC_OR_DEPARTMENT_OTHER): Payer: Self-pay | Admitting: Family Medicine

## 2019-01-03 ENCOUNTER — Other Ambulatory Visit: Payer: Self-pay

## 2019-01-03 DIAGNOSIS — K6389 Other specified diseases of intestine: Secondary | ICD-10-CM | POA: Diagnosis not present

## 2019-01-03 DIAGNOSIS — R1031 Right lower quadrant pain: Secondary | ICD-10-CM

## 2019-01-03 IMAGING — CT CT ABDOMEN AND PELVIS WITH CONTRAST
2 of 5 series · 16 of 46 positions shown, 18 images · IV contrast (omnipaque)
Comparison: None.

CLINICAL DATA: Right lower quadrant abdominal pain. Concern for
acute appendicitis.

EXAM:
CT ABDOMEN AND PELVIS WITH CONTRAST
TECHNIQUE: Multidetector CT imaging of the abdomen and pelvis was performed
using the standard protocol following bolus administration of
intravenous contrast.
CONTRAST:  100mL OMNIPAQUE IOHEXOL 300 MG/ML  SOLN

[Series 2: axial st · axial · 0.87mm/px · z∈[-623,-163]mm · 13 of 104 slices shown, 15 images]
[im 6/104  soft-tissue]
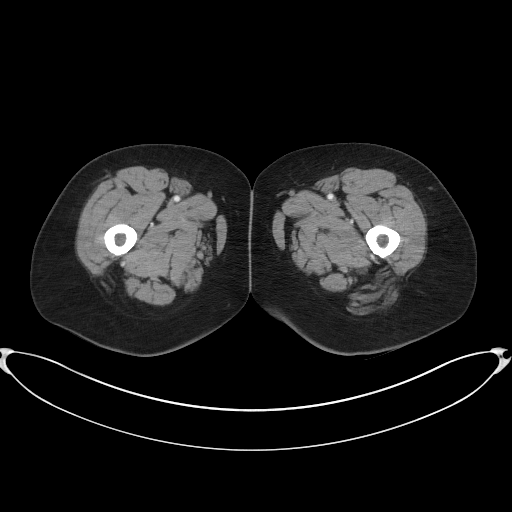
[im 6/104  bone]
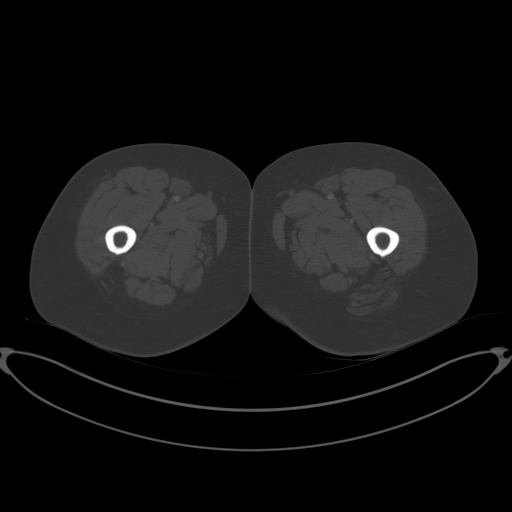
[im 16/104  soft-tissue]
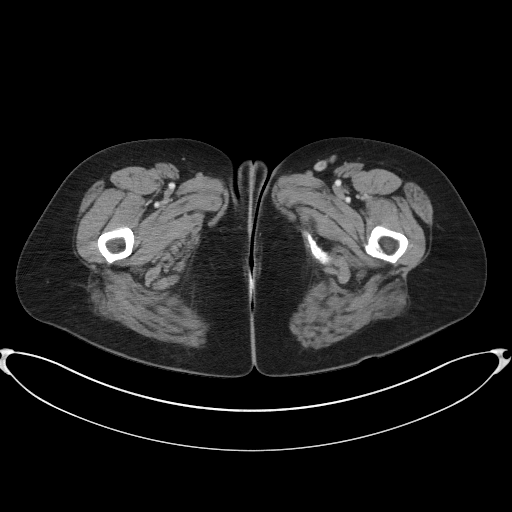
[im 21/104  soft-tissue]
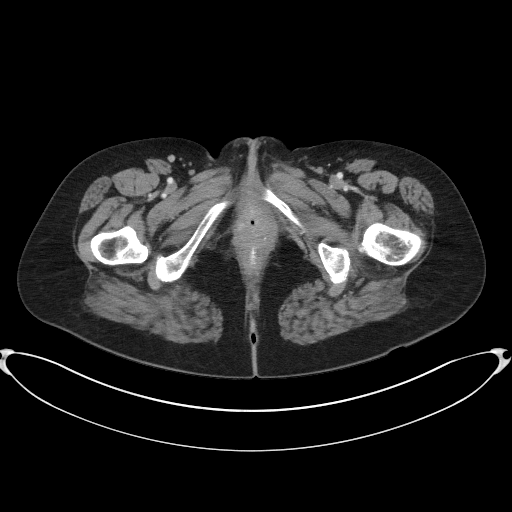
[im 31/104  soft-tissue]
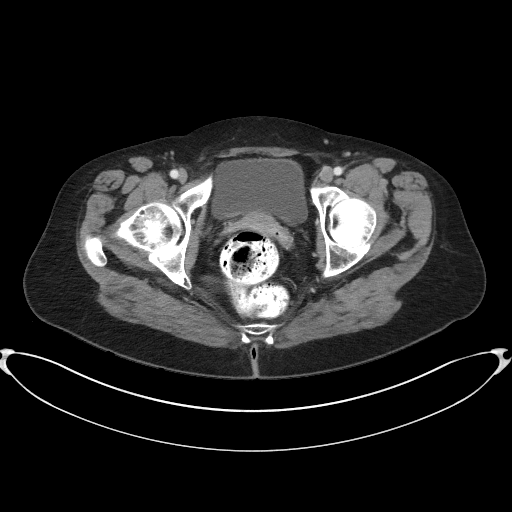
[im 37/104  soft-tissue]
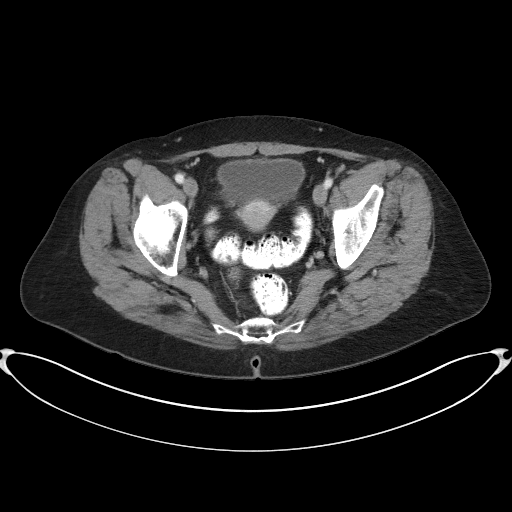
[im 47/104  soft-tissue]
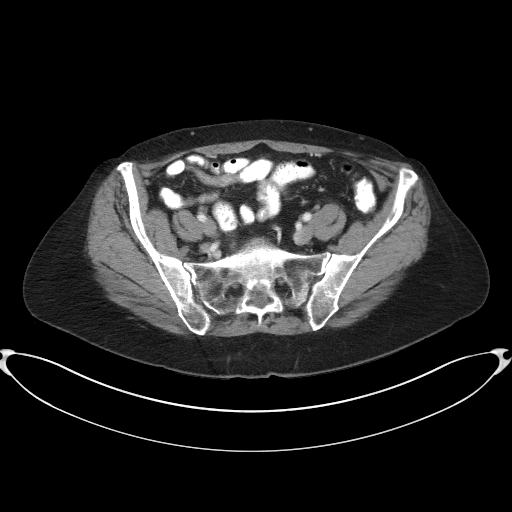
[im 52/104  soft-tissue]
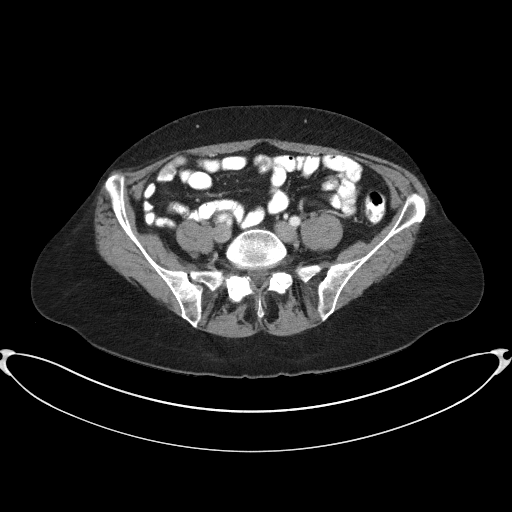
[im 57/104  soft-tissue]
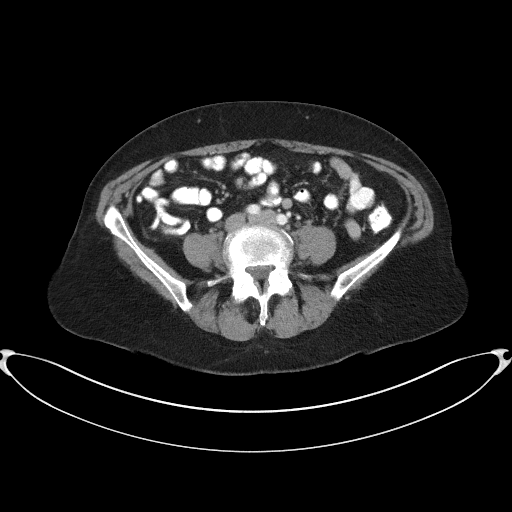
[im 67/104  soft-tissue]
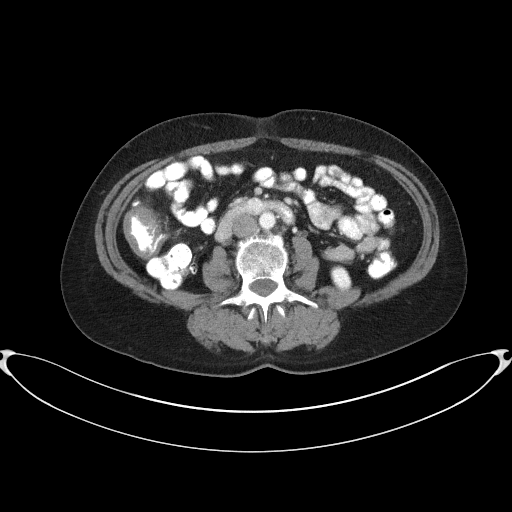
[im 67/104  bone]
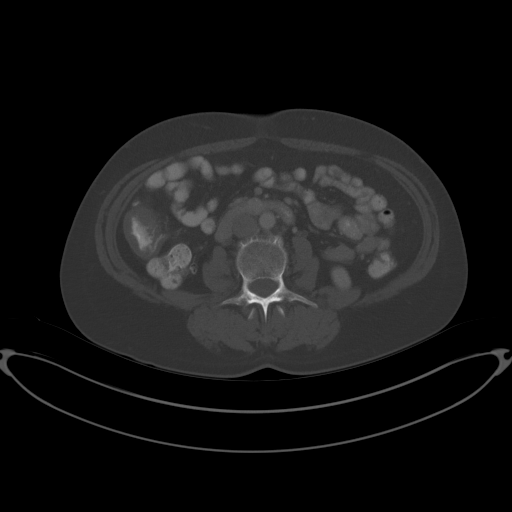
[im 73/104  soft-tissue]
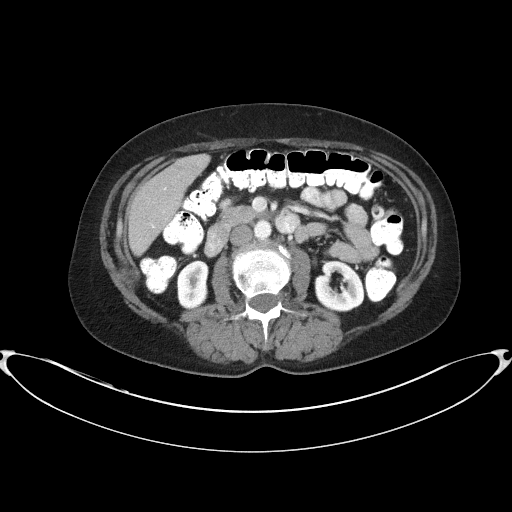
[im 83/104  soft-tissue]
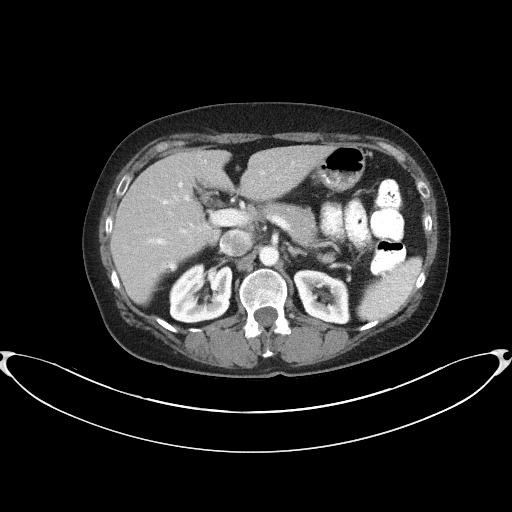
[im 88/104  soft-tissue]
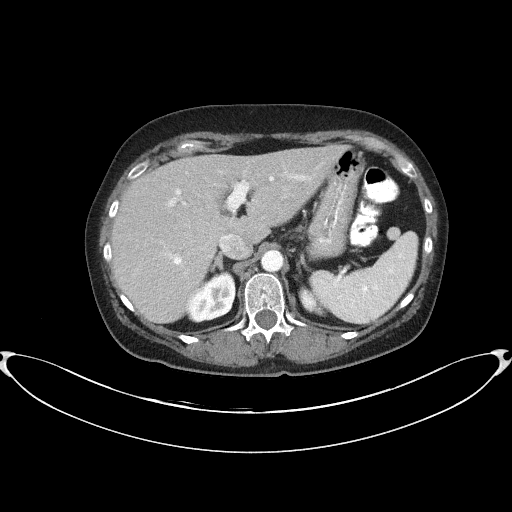
[im 98/104  soft-tissue]
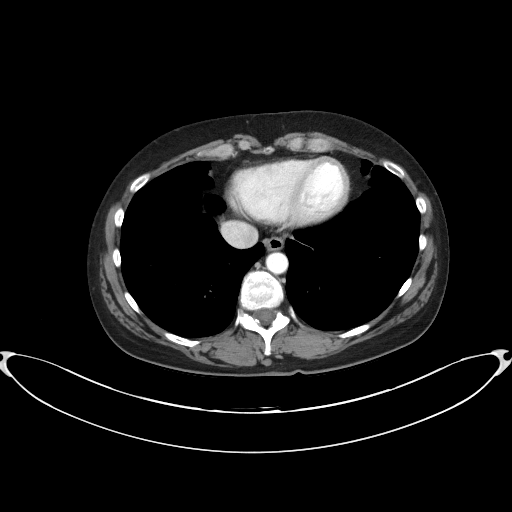

[Series 4: coronal st · coronal · 0.82mm/px · 3 of 81 slices shown]
[im 27/81  soft-tissue]
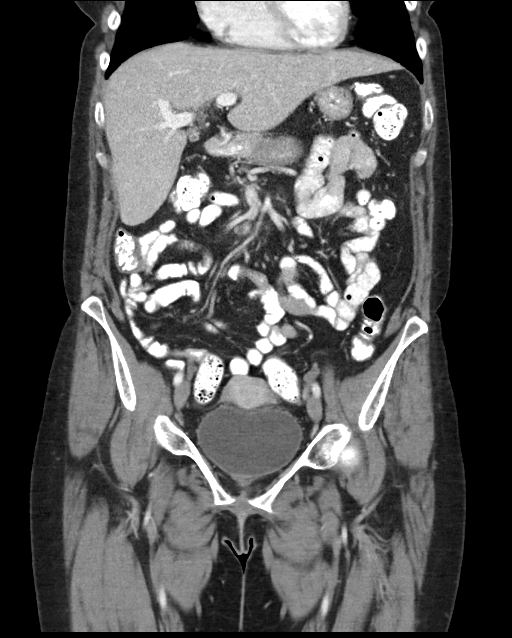
[im 36/81  soft-tissue]
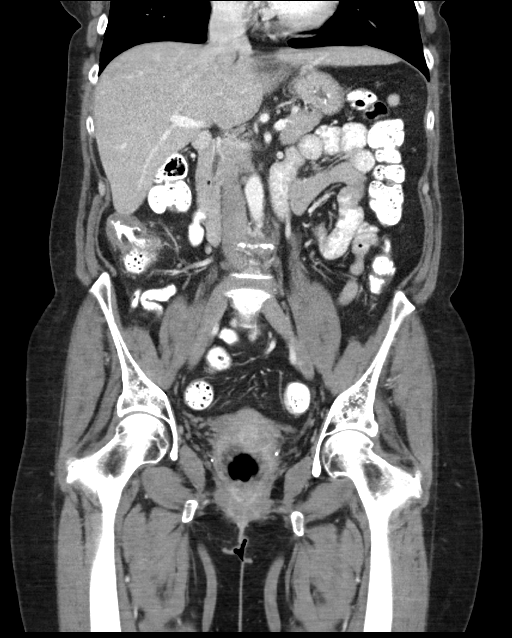
[im 45/81  soft-tissue]
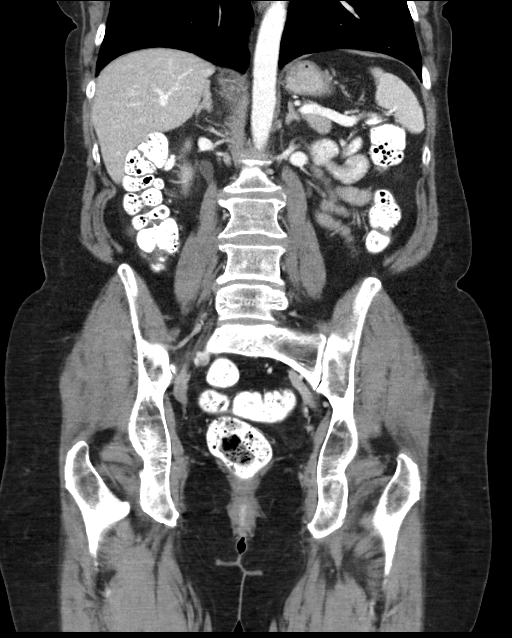

[16 of 46 positions shown; findings below may reference images not displayed]

FINDINGS: Lower chest: No acute abnormality.

Hepatobiliary: No focal liver abnormality is seen. No gallstones,
gallbladder wall thickening, or biliary dilatation.

Pancreas: Unremarkable. No pancreatic ductal dilatation or
surrounding inflammatory changes.

Spleen: Normal in size without focal abnormality.

Adrenals/Urinary Tract: Adrenal glands are unremarkable. Kidneys are
normal, without renal calculi, focal lesion, or hydronephrosis.
Bladder is unremarkable.

Stomach/Bowel: Oral contrast is seen to the level of the rectum.
There are very few colonic diverticula. There is no evidence of a
small-bowel obstruction. The appendix is located in the right lower
quadrant and is unremarkable. There appears to be a focal area of
wall thickening involving the cecum. This is best appreciated on the
coronal series image 35. There is some mild adjacent inflammatory
changes. There are no pathologically enlarged lymph nodes in the
right lower quadrant.

Vascular/Lymphatic: Aortic atherosclerosis. No enlarged abdominal or
pelvic lymph nodes.

Reproductive: Uterus and bilateral adnexa are unremarkable.

Other: No abdominal wall hernia or abnormality. No abdominopelvic
ascites.

Musculoskeletal: No acute or significant osseous findings.
IMPRESSION: 1. No CT evidence of acute appendicitis.
2. Focal wall thickening of a small portion of the cecum as detailed
above. This could represent a focal colitis (infectious or
inflammatory). Follow-up with nonemergent colonoscopy is recommended
as an underlying mass cannot be excluded on this exam.

## 2019-01-03 MED ORDER — IOHEXOL 300 MG/ML  SOLN
100.0000 mL | Freq: Once | INTRAMUSCULAR | Status: AC | PRN
  Administered 2019-01-03: 14:00:00 100 mL via INTRAVENOUS

## 2019-01-31 DIAGNOSIS — F3176 Bipolar disorder, in full remission, most recent episode depressed: Secondary | ICD-10-CM | POA: Diagnosis not present

## 2019-01-31 DIAGNOSIS — F3174 Bipolar disorder, in full remission, most recent episode manic: Secondary | ICD-10-CM | POA: Diagnosis not present

## 2020-03-20 ENCOUNTER — Other Ambulatory Visit: Payer: Self-pay | Admitting: Family Medicine

## 2020-03-20 DIAGNOSIS — Z1231 Encounter for screening mammogram for malignant neoplasm of breast: Secondary | ICD-10-CM

## 2020-03-26 ENCOUNTER — Ambulatory Visit
Admission: RE | Admit: 2020-03-26 | Discharge: 2020-03-26 | Disposition: A | Discharge status: Home / Self Care | Payer: Commercial Managed Care - PPO | Source: Ambulatory Visit | Attending: Family Medicine | Admitting: Family Medicine

## 2020-03-26 ENCOUNTER — Other Ambulatory Visit: Payer: Self-pay

## 2020-03-26 DIAGNOSIS — Z1231 Encounter for screening mammogram for malignant neoplasm of breast: Secondary | ICD-10-CM

## 2021-03-25 DIAGNOSIS — Z0000 Encounter for general adult medical examination without abnormal findings: Secondary | ICD-10-CM | POA: Diagnosis not present

## 2021-03-25 DIAGNOSIS — Z23 Encounter for immunization: Secondary | ICD-10-CM | POA: Diagnosis not present

## 2021-03-25 DIAGNOSIS — Z1159 Encounter for screening for other viral diseases: Secondary | ICD-10-CM | POA: Diagnosis not present

## 2021-03-25 DIAGNOSIS — F329 Major depressive disorder, single episode, unspecified: Secondary | ICD-10-CM | POA: Diagnosis not present

## 2021-03-25 DIAGNOSIS — Z13220 Encounter for screening for lipoid disorders: Secondary | ICD-10-CM | POA: Diagnosis not present

## 2021-03-28 ENCOUNTER — Other Ambulatory Visit: Payer: Self-pay | Admitting: Family Medicine

## 2021-03-28 DIAGNOSIS — E2839 Other primary ovarian failure: Secondary | ICD-10-CM

## 2021-03-28 DIAGNOSIS — Z1231 Encounter for screening mammogram for malignant neoplasm of breast: Secondary | ICD-10-CM

## 2021-05-12 DIAGNOSIS — L578 Other skin changes due to chronic exposure to nonionizing radiation: Secondary | ICD-10-CM | POA: Diagnosis not present

## 2021-05-12 DIAGNOSIS — D1801 Hemangioma of skin and subcutaneous tissue: Secondary | ICD-10-CM | POA: Diagnosis not present

## 2021-05-12 DIAGNOSIS — L814 Other melanin hyperpigmentation: Secondary | ICD-10-CM | POA: Diagnosis not present

## 2021-05-12 DIAGNOSIS — D485 Neoplasm of uncertain behavior of skin: Secondary | ICD-10-CM | POA: Diagnosis not present

## 2021-05-12 DIAGNOSIS — L570 Actinic keratosis: Secondary | ICD-10-CM | POA: Diagnosis not present

## 2021-05-12 DIAGNOSIS — C44519 Basal cell carcinoma of skin of other part of trunk: Secondary | ICD-10-CM | POA: Diagnosis not present

## 2021-05-12 DIAGNOSIS — L821 Other seborrheic keratosis: Secondary | ICD-10-CM | POA: Diagnosis not present

## 2021-05-26 ENCOUNTER — Ambulatory Visit: Payer: Commercial Managed Care - PPO

## 2021-06-18 ENCOUNTER — Ambulatory Visit
Admission: RE | Admit: 2021-06-18 | Discharge: 2021-06-18 | Disposition: A | Discharge status: Home / Self Care | Payer: BC Managed Care – PPO | Source: Ambulatory Visit | Attending: Family Medicine | Admitting: Family Medicine

## 2021-06-18 ENCOUNTER — Other Ambulatory Visit: Payer: Self-pay

## 2021-06-18 DIAGNOSIS — Z1231 Encounter for screening mammogram for malignant neoplasm of breast: Secondary | ICD-10-CM | POA: Diagnosis not present

## 2021-06-23 DIAGNOSIS — C44519 Basal cell carcinoma of skin of other part of trunk: Secondary | ICD-10-CM | POA: Diagnosis not present

## 2021-08-22 DIAGNOSIS — F3174 Bipolar disorder, in full remission, most recent episode manic: Secondary | ICD-10-CM | POA: Diagnosis not present

## 2021-08-22 DIAGNOSIS — F3176 Bipolar disorder, in full remission, most recent episode depressed: Secondary | ICD-10-CM | POA: Diagnosis not present

## 2021-08-22 DIAGNOSIS — F410 Panic disorder [episodic paroxysmal anxiety] without agoraphobia: Secondary | ICD-10-CM | POA: Diagnosis not present

## 2021-08-22 DIAGNOSIS — G4700 Insomnia, unspecified: Secondary | ICD-10-CM | POA: Diagnosis not present

## 2021-09-29 ENCOUNTER — Ambulatory Visit
Admission: RE | Admit: 2021-09-29 | Discharge: 2021-09-29 | Disposition: A | Discharge status: Home / Self Care | Payer: BC Managed Care – PPO | Source: Ambulatory Visit | Attending: Family Medicine | Admitting: Family Medicine

## 2021-09-29 DIAGNOSIS — E2839 Other primary ovarian failure: Secondary | ICD-10-CM

## 2021-09-29 DIAGNOSIS — Z780 Asymptomatic menopausal state: Secondary | ICD-10-CM | POA: Diagnosis not present

## 2021-09-29 DIAGNOSIS — M810 Age-related osteoporosis without current pathological fracture: Secondary | ICD-10-CM | POA: Diagnosis not present

## 2021-09-29 DIAGNOSIS — M8588 Other specified disorders of bone density and structure, other site: Secondary | ICD-10-CM | POA: Diagnosis not present

## 2021-10-01 DIAGNOSIS — M818 Other osteoporosis without current pathological fracture: Secondary | ICD-10-CM | POA: Diagnosis not present

## 2021-10-08 DIAGNOSIS — J029 Acute pharyngitis, unspecified: Secondary | ICD-10-CM | POA: Diagnosis not present

## 2021-10-22 DIAGNOSIS — J019 Acute sinusitis, unspecified: Secondary | ICD-10-CM | POA: Diagnosis not present

## 2021-10-22 DIAGNOSIS — J029 Acute pharyngitis, unspecified: Secondary | ICD-10-CM | POA: Diagnosis not present

## 2021-10-22 DIAGNOSIS — J40 Bronchitis, not specified as acute or chronic: Secondary | ICD-10-CM | POA: Diagnosis not present

## 2022-02-06 DIAGNOSIS — F5101 Primary insomnia: Secondary | ICD-10-CM | POA: Diagnosis not present

## 2022-02-06 DIAGNOSIS — F3174 Bipolar disorder, in full remission, most recent episode manic: Secondary | ICD-10-CM | POA: Diagnosis not present

## 2022-02-06 DIAGNOSIS — F3176 Bipolar disorder, in full remission, most recent episode depressed: Secondary | ICD-10-CM | POA: Diagnosis not present

## 2022-02-06 DIAGNOSIS — F410 Panic disorder [episodic paroxysmal anxiety] without agoraphobia: Secondary | ICD-10-CM | POA: Diagnosis not present

## 2022-04-01 DIAGNOSIS — F329 Major depressive disorder, single episode, unspecified: Secondary | ICD-10-CM | POA: Diagnosis not present

## 2022-04-01 DIAGNOSIS — Z13220 Encounter for screening for lipoid disorders: Secondary | ICD-10-CM | POA: Diagnosis not present

## 2022-04-01 DIAGNOSIS — M818 Other osteoporosis without current pathological fracture: Secondary | ICD-10-CM | POA: Diagnosis not present

## 2022-04-01 DIAGNOSIS — E2839 Other primary ovarian failure: Secondary | ICD-10-CM | POA: Diagnosis not present

## 2022-04-01 DIAGNOSIS — Z0000 Encounter for general adult medical examination without abnormal findings: Secondary | ICD-10-CM | POA: Diagnosis not present

## 2022-04-15 ENCOUNTER — Ambulatory Visit: Payer: Self-pay

## 2022-04-15 NOTE — Patient Outreach (Signed)
  Care Coordination   04/15/2022 Name: Rida Loudin MRN: [id number] DOB: 1961/04/27   Care Coordination Outreach Attempts:  An unsuccessful telephone outreach was attempted today to offer the patient information about available care coordination services as a benefit of their health plan.   Follow Up Plan:  Additional outreach attempts will be made to offer the patient care coordination information and services.   Encounter Outcome:  No Answer  Care Coordination Interventions Activated:  No   Care Coordination Interventions:  No, not indicated    [facility] Care Management [phone]

## 2022-05-21 DIAGNOSIS — R300 Dysuria: Secondary | ICD-10-CM | POA: Diagnosis not present

## 2022-05-28 DIAGNOSIS — M5451 Vertebrogenic low back pain: Secondary | ICD-10-CM | POA: Diagnosis not present

## 2022-06-02 DIAGNOSIS — M545 Low back pain, unspecified: Secondary | ICD-10-CM | POA: Diagnosis not present

## 2022-06-09 DIAGNOSIS — L578 Other skin changes due to chronic exposure to nonionizing radiation: Secondary | ICD-10-CM | POA: Diagnosis not present

## 2022-06-09 DIAGNOSIS — L814 Other melanin hyperpigmentation: Secondary | ICD-10-CM | POA: Diagnosis not present

## 2022-06-09 DIAGNOSIS — D229 Melanocytic nevi, unspecified: Secondary | ICD-10-CM | POA: Diagnosis not present

## 2022-06-09 DIAGNOSIS — L821 Other seborrheic keratosis: Secondary | ICD-10-CM | POA: Diagnosis not present

## 2022-06-10 ENCOUNTER — Telehealth: Payer: Self-pay

## 2022-06-10 ENCOUNTER — Other Ambulatory Visit: Payer: Self-pay | Admitting: Family Medicine

## 2022-06-10 DIAGNOSIS — Z1231 Encounter for screening mammogram for malignant neoplasm of breast: Secondary | ICD-10-CM

## 2022-06-10 NOTE — Patient Outreach (Signed)
  Care Coordination   06/10/2022 Name: Landree Fernholz MRN: [id number] DOB: [birth year]   Care Coordination Outreach Attempts:  A second unsuccessful outreach was attempted today to offer the patient with information about available care coordination services as a benefit of their health plan.     Follow Up Plan:  Additional outreach attempts will be made to offer the patient care coordination information and services.   Encounter Outcome:  No Answer  Care Coordination Interventions Activated:  No   Care Coordination Interventions:  No, not indicated    [facility] [phone]

## 2022-06-11 ENCOUNTER — Telehealth: Payer: Self-pay

## 2022-07-29 ENCOUNTER — Ambulatory Visit: Payer: BC Managed Care – PPO

## 2022-08-04 ENCOUNTER — Ambulatory Visit: Payer: BC Managed Care – PPO

## 2022-08-04 ENCOUNTER — Ambulatory Visit
Admission: RE | Admit: 2022-08-04 | Discharge: 2022-08-04 | Disposition: A | Discharge status: Home / Self Care | Payer: BC Managed Care – PPO | Source: Ambulatory Visit | Attending: Family Medicine | Admitting: Family Medicine

## 2022-08-04 DIAGNOSIS — Z1231 Encounter for screening mammogram for malignant neoplasm of breast: Secondary | ICD-10-CM | POA: Diagnosis not present

## 2022-08-20 DIAGNOSIS — R42 Dizziness and giddiness: Secondary | ICD-10-CM | POA: Diagnosis not present

## 2022-08-20 DIAGNOSIS — R0981 Nasal congestion: Secondary | ICD-10-CM | POA: Diagnosis not present

## 2022-08-20 DIAGNOSIS — R519 Headache, unspecified: Secondary | ICD-10-CM | POA: Diagnosis not present

## 2022-08-20 DIAGNOSIS — R0602 Shortness of breath: Secondary | ICD-10-CM | POA: Diagnosis not present

## 2022-08-20 DIAGNOSIS — Z03818 Encounter for observation for suspected exposure to other biological agents ruled out: Secondary | ICD-10-CM | POA: Diagnosis not present

## 2022-08-26 DIAGNOSIS — R42 Dizziness and giddiness: Secondary | ICD-10-CM | POA: Diagnosis not present

## 2022-08-26 DIAGNOSIS — R4189 Other symptoms and signs involving cognitive functions and awareness: Secondary | ICD-10-CM | POA: Diagnosis not present

## 2022-08-26 DIAGNOSIS — G4700 Insomnia, unspecified: Secondary | ICD-10-CM | POA: Diagnosis not present

## 2022-09-02 DIAGNOSIS — F3111 Bipolar disorder, current episode manic without psychotic features, mild: Secondary | ICD-10-CM | POA: Diagnosis not present

## 2022-09-02 DIAGNOSIS — G4700 Insomnia, unspecified: Secondary | ICD-10-CM | POA: Diagnosis not present

## 2022-09-02 DIAGNOSIS — F410 Panic disorder [episodic paroxysmal anxiety] without agoraphobia: Secondary | ICD-10-CM | POA: Diagnosis not present

## 2022-09-02 DIAGNOSIS — F3132 Bipolar disorder, current episode depressed, moderate: Secondary | ICD-10-CM | POA: Diagnosis not present

## 2022-09-16 DIAGNOSIS — G4700 Insomnia, unspecified: Secondary | ICD-10-CM | POA: Diagnosis not present

## 2022-09-16 DIAGNOSIS — F3131 Bipolar disorder, current episode depressed, mild: Secondary | ICD-10-CM | POA: Diagnosis not present

## 2022-09-16 DIAGNOSIS — G2581 Restless legs syndrome: Secondary | ICD-10-CM | POA: Diagnosis not present

## 2022-10-06 DIAGNOSIS — F3176 Bipolar disorder, in full remission, most recent episode depressed: Secondary | ICD-10-CM | POA: Diagnosis not present

## 2022-10-06 DIAGNOSIS — F410 Panic disorder [episodic paroxysmal anxiety] without agoraphobia: Secondary | ICD-10-CM | POA: Diagnosis not present

## 2022-10-06 DIAGNOSIS — F3174 Bipolar disorder, in full remission, most recent episode manic: Secondary | ICD-10-CM | POA: Diagnosis not present

## 2022-10-06 DIAGNOSIS — G4700 Insomnia, unspecified: Secondary | ICD-10-CM | POA: Diagnosis not present

## 2023-07-02 ENCOUNTER — Other Ambulatory Visit: Payer: Self-pay | Admitting: Family Medicine

## 2023-07-02 DIAGNOSIS — Z13820 Encounter for screening for osteoporosis: Secondary | ICD-10-CM

## 2023-12-15 DIAGNOSIS — F410 Panic disorder [episodic paroxysmal anxiety] without agoraphobia: Secondary | ICD-10-CM | POA: Diagnosis not present

## 2023-12-15 DIAGNOSIS — F4322 Adjustment disorder with anxiety: Secondary | ICD-10-CM | POA: Diagnosis not present

## 2023-12-15 DIAGNOSIS — F3176 Bipolar disorder, in full remission, most recent episode depressed: Secondary | ICD-10-CM | POA: Diagnosis not present

## 2023-12-15 DIAGNOSIS — F5101 Primary insomnia: Secondary | ICD-10-CM | POA: Diagnosis not present

## 2024-01-18 DIAGNOSIS — R4189 Other symptoms and signs involving cognitive functions and awareness: Secondary | ICD-10-CM | POA: Diagnosis not present

## 2024-01-18 DIAGNOSIS — G4700 Insomnia, unspecified: Secondary | ICD-10-CM | POA: Diagnosis not present

## 2024-01-18 DIAGNOSIS — Z0000 Encounter for general adult medical examination without abnormal findings: Secondary | ICD-10-CM | POA: Diagnosis not present

## 2024-01-18 DIAGNOSIS — F3131 Bipolar disorder, current episode depressed, mild: Secondary | ICD-10-CM | POA: Diagnosis not present

## 2024-01-18 DIAGNOSIS — M818 Other osteoporosis without current pathological fracture: Secondary | ICD-10-CM | POA: Diagnosis not present

## 2024-01-31 DIAGNOSIS — G4700 Insomnia, unspecified: Secondary | ICD-10-CM | POA: Diagnosis not present

## 2024-01-31 DIAGNOSIS — R0683 Snoring: Secondary | ICD-10-CM | POA: Diagnosis not present

## 2024-01-31 DIAGNOSIS — F3131 Bipolar disorder, current episode depressed, mild: Secondary | ICD-10-CM | POA: Diagnosis not present

## 2024-02-01 DIAGNOSIS — F5101 Primary insomnia: Secondary | ICD-10-CM | POA: Diagnosis not present

## 2024-02-01 DIAGNOSIS — F410 Panic disorder [episodic paroxysmal anxiety] without agoraphobia: Secondary | ICD-10-CM | POA: Diagnosis not present

## 2024-02-01 DIAGNOSIS — F3132 Bipolar disorder, current episode depressed, moderate: Secondary | ICD-10-CM | POA: Diagnosis not present

## 2024-02-01 DIAGNOSIS — F4322 Adjustment disorder with anxiety: Secondary | ICD-10-CM | POA: Diagnosis not present

## 2024-02-17 ENCOUNTER — Other Ambulatory Visit: Payer: BC Managed Care – PPO

## 2024-02-23 DIAGNOSIS — G4733 Obstructive sleep apnea (adult) (pediatric): Secondary | ICD-10-CM | POA: Diagnosis not present

## 2024-02-23 DIAGNOSIS — G4721 Circadian rhythm sleep disorder, delayed sleep phase type: Secondary | ICD-10-CM | POA: Diagnosis not present

## 2024-02-29 ENCOUNTER — Ambulatory Visit (HOSPITAL_BASED_OUTPATIENT_CLINIC_OR_DEPARTMENT_OTHER)
Admission: RE | Admit: 2024-02-29 | Discharge: 2024-02-29 | Disposition: A | Discharge status: Home / Self Care | Source: Ambulatory Visit | Attending: Family Medicine | Admitting: Family Medicine

## 2024-02-29 DIAGNOSIS — Z13820 Encounter for screening for osteoporosis: Secondary | ICD-10-CM | POA: Insufficient documentation

## 2024-02-29 DIAGNOSIS — M810 Age-related osteoporosis without current pathological fracture: Secondary | ICD-10-CM | POA: Diagnosis not present

## 2024-02-29 DIAGNOSIS — S99912A Unspecified injury of left ankle, initial encounter: Secondary | ICD-10-CM | POA: Diagnosis not present

## 2024-02-29 DIAGNOSIS — Z780 Asymptomatic menopausal state: Secondary | ICD-10-CM | POA: Diagnosis not present

## 2024-02-29 DIAGNOSIS — M25572 Pain in left ankle and joints of left foot: Secondary | ICD-10-CM | POA: Diagnosis not present

## 2024-03-04 DIAGNOSIS — F3174 Bipolar disorder, in full remission, most recent episode manic: Secondary | ICD-10-CM | POA: Diagnosis not present

## 2024-03-04 DIAGNOSIS — F5101 Primary insomnia: Secondary | ICD-10-CM | POA: Diagnosis not present

## 2024-03-04 DIAGNOSIS — F3176 Bipolar disorder, in full remission, most recent episode depressed: Secondary | ICD-10-CM | POA: Diagnosis not present

## 2024-03-04 DIAGNOSIS — F410 Panic disorder [episodic paroxysmal anxiety] without agoraphobia: Secondary | ICD-10-CM | POA: Diagnosis not present

## 2024-03-10 DIAGNOSIS — G4733 Obstructive sleep apnea (adult) (pediatric): Secondary | ICD-10-CM | POA: Diagnosis not present

## 2024-03-24 DIAGNOSIS — G4733 Obstructive sleep apnea (adult) (pediatric): Secondary | ICD-10-CM | POA: Diagnosis not present

## 2024-03-28 DIAGNOSIS — M818 Other osteoporosis without current pathological fracture: Secondary | ICD-10-CM | POA: Diagnosis not present

## 2024-03-31 DIAGNOSIS — L0291 Cutaneous abscess, unspecified: Secondary | ICD-10-CM | POA: Diagnosis not present

## 2024-05-11 DIAGNOSIS — G4733 Obstructive sleep apnea (adult) (pediatric): Secondary | ICD-10-CM | POA: Diagnosis not present

## 2024-06-14 DIAGNOSIS — G2581 Restless legs syndrome: Secondary | ICD-10-CM | POA: Diagnosis not present

## 2024-06-14 DIAGNOSIS — G4721 Circadian rhythm sleep disorder, delayed sleep phase type: Secondary | ICD-10-CM | POA: Diagnosis not present

## 2024-06-14 DIAGNOSIS — F411 Generalized anxiety disorder: Secondary | ICD-10-CM | POA: Diagnosis not present

## 2024-06-14 DIAGNOSIS — G4733 Obstructive sleep apnea (adult) (pediatric): Secondary | ICD-10-CM | POA: Diagnosis not present

## 2024-06-19 DIAGNOSIS — D229 Melanocytic nevi, unspecified: Secondary | ICD-10-CM | POA: Diagnosis not present

## 2024-06-19 DIAGNOSIS — L570 Actinic keratosis: Secondary | ICD-10-CM | POA: Diagnosis not present

## 2024-06-19 DIAGNOSIS — L821 Other seborrheic keratosis: Secondary | ICD-10-CM | POA: Diagnosis not present

## 2024-06-19 DIAGNOSIS — L578 Other skin changes due to chronic exposure to nonionizing radiation: Secondary | ICD-10-CM | POA: Diagnosis not present

## 2024-06-19 DIAGNOSIS — L814 Other melanin hyperpigmentation: Secondary | ICD-10-CM | POA: Diagnosis not present

## 2024-07-04 DIAGNOSIS — M7711 Lateral epicondylitis, right elbow: Secondary | ICD-10-CM | POA: Diagnosis not present

## 2024-07-11 DIAGNOSIS — G4733 Obstructive sleep apnea (adult) (pediatric): Secondary | ICD-10-CM | POA: Diagnosis not present

## 2024-08-01 NOTE — Progress Notes (Signed)
 [facility] 08/01/24   SUBJECTIVE Chief complaint: Patient reports with caries lesion(s) in need of restorative treatment.  Health History? Medical History[1] Medications Ordered Prior to Encounter[2] Allergies[3]  Problem List[4]  Surgical History[5] Family History[6] Social History[7]  Medical Risk Assessment ASA GRADE: ASA 2 - Patient with mild systemic disease with no functional limitations  Medical history was reviewed and updated. No contraindication to care.  Pain Level Tooth/teeth to be treated testing asymptomatic. Pain score currently: 0 (0-10 scale)  Patient's expectations: Patient understands the procedure and expects a functional and esthetically pleasing restoration.  OBJECTIVE Vital Signs BP Readings from Last 1 Encounters:  08/01/24 134/84  Pulse: 69  Pre-procedure Examination Tooth/teeth #21 being treated for the following condition(s): Caries  Pre-procedure Radiograph/Intra-oral Image  Radiograph interpretation:   Procedure ANESTHESIA Topical anesthetic: Benzocaine 20% Septocaine (Articaine hydrochloride 4% w/ epi 1:100K) x Carpules: 1 carpule;  ISOLATION Cotton rolls isolation PREPARATION Caries excavated BASE/LINER Hurriseal MATRIX Full band in [place] retainer RESTORATIVE MATERIALS Bonding agent: Optibond FL,  Composite resin: TPH Spectra  ST, shade: A3 Sealant: Clinpro Amalgam: NA  FINISHING/POLISHING Occlusion checked and adjusted Restoration finished and polished using   Post-procedure examination: Uneventful procedure, goal with procedure achieved.  Additional notes: None.  ASSESSMENT Diagnosis: Caries, restorable Prognosis: Good  PLAN Post-operative Instructions Patient informed about possible short-term sensitivity to cold.  Provider should be contacted in the event of severe sensitivity or any sensitivity to heat, and/or pain on biting. Patient advised to be careful with soft tissues on the  anesthetized side until the anesthesia wears off.  Patient Education Patient educated on proper oral hygiene techniques and the importance of regular dental check-ups in order to maximize the longevity of the restoration.  Referrals: None  Next visit: Hygiene appointment: NV: [facility] Adult  Treatment Providers Lizabeth Diver, DA II Selinda Becton, DDS [facility] [address] [place], [place]  [address] [phone]        [1] Past Medical History: Diagnosis Date   Bipolar disorder    Depression   [2] Current Outpatient Medications on File Prior to Visit  Medication Sig Dispense Refill   alendronate (FOSAMAX) 70 mg tablet TAKE 1 TABLET BY MOUTH 1 TIME A WEEK 30 MINUTES BEFORE FIRST FOOD OR BEVERAGE OR MEDICINE OF THE DAY WITH WATER     ALPRAZolam (XANAX) 0.25 mg tablet TAKE 1 TO 2 TABLETS BY MOUTH TWICE DAILY AS NEEDED     Caplyta 42 mg capsule Take 1 capsule by mouth every night.     cloNIDine (CATAPRES) 0.1 mg tablet TAKE 1 TO 3 TABLETS BY MOUTH EVERY NIGHT AT BEDTIME     metaxalone (SKELAXIN) 800 mg tablet Take 800 mg by mouth every night.     predniSONE (DELTASONE) 20 mg tablet TAKE 2 TABLETS BY MOUTH DAILY FOR 4 DAYS THEN 1 TABLET DAILY FOR 4 DAYS     QUEtiapine (SEROquel) 200 mg tablet Take 200-400 mg by mouth every night.     QUEtiapine (SEROquel) 25 mg tablet TAKE 1 TO 3 TABLETS BY MOUTH EVERY NIGHT AT BEDTIME     traZODone (DESYREL) 100 mg tablet TAKE 2 TABLETS EVERY NIGHT AT BEDTIME     Vraylar 4.5 mg capsule Take 1 capsule by mouth every night.     Vraylar 6 mg capsule Take 1 capsule by mouth every night.     No current facility-administered medications on file prior to visit.  [3] Allergies Allergen Reactions   Codeine Nausea Only  Other Reaction(s): Unknown  Nausea  [4] Patient Active Problem List Diagnosis   Bipolar affective disorder, currently depressed, mild (CMS/HCC)   Degenerative joint disease  involving multiple joints   Psoriasis   Estrogen deficiency   History of basal cell carcinoma (BCC)   History of nutritional deficiency   Major depressive disorder, single episode, unspecified   Osteopenia   Restless legs syndrome   Closed fracture of third metatarsal bone   Compression fracture of L2 (CMS/HCC)   Hallux valgus with bunions   Hammer toe   Mechanical low back pain   Pain of lumbar spine   Metatarsalgia of left foot  [5] History reviewed. No pertinent surgical history. [6] No family history on file. [7] Social History Tobacco Use   Smoking status: Former    Current packs/day: 0.50    Average packs/day: 0.5 packs/day for 15.0 years (7.5 ttl pk-yrs)    Types: Cigarettes   Smokeless tobacco: Never  Substance Use Topics   Alcohol use: Yes   Drug use: Not Currently

## 2024-09-06 ENCOUNTER — Emergency Department (HOSPITAL_BASED_OUTPATIENT_CLINIC_OR_DEPARTMENT_OTHER)

## 2024-09-06 ENCOUNTER — Encounter (HOSPITAL_BASED_OUTPATIENT_CLINIC_OR_DEPARTMENT_OTHER): Payer: Self-pay | Admitting: Emergency Medicine

## 2024-09-06 ENCOUNTER — Emergency Department (HOSPITAL_BASED_OUTPATIENT_CLINIC_OR_DEPARTMENT_OTHER)
Admission: EM | Admit: 2024-09-06 | Discharge: 2024-09-06 | Disposition: A | Discharge status: Home / Self Care | Attending: Emergency Medicine | Admitting: Emergency Medicine

## 2024-09-06 ENCOUNTER — Other Ambulatory Visit: Payer: Self-pay

## 2024-09-06 DIAGNOSIS — K219 Gastro-esophageal reflux disease without esophagitis: Secondary | ICD-10-CM | POA: Diagnosis not present

## 2024-09-06 DIAGNOSIS — R197 Diarrhea, unspecified: Secondary | ICD-10-CM | POA: Insufficient documentation

## 2024-09-06 DIAGNOSIS — R1013 Epigastric pain: Secondary | ICD-10-CM | POA: Diagnosis present

## 2024-09-06 LAB — URINALYSIS, ROUTINE W REFLEX MICROSCOPIC
Bilirubin Urine: NEGATIVE
Glucose, UA: NEGATIVE mg/dL
Hgb urine dipstick: NEGATIVE
Ketones, ur: NEGATIVE mg/dL
Leukocytes,Ua: NEGATIVE
Nitrite: NEGATIVE
Protein, ur: NEGATIVE mg/dL
Specific Gravity, Urine: <1.005 — ABNORMAL LOW (ref 1.005–1.030)
pH: 6.5 (ref 5.0–8.0)

## 2024-09-06 LAB — COMPREHENSIVE METABOLIC PANEL WITH GFR
ALT: 25 U/L (ref 0–44)
AST: 27 U/L (ref 15–41)
Albumin: 4.5 g/dL (ref 3.5–5.0)
Alkaline Phosphatase: 83 U/L (ref 38–126)
Anion gap: 11 (ref 5–15)
BUN: 19 mg/dL (ref 8–23)
CO2: 25 mmol/L (ref 22–32)
Calcium: 10.5 mg/dL — ABNORMAL HIGH (ref 8.9–10.3)
Chloride: 98 mmol/L (ref 98–111)
Creatinine, Ser: 0.7 mg/dL (ref 0.44–1.00)
GFR, Estimated: >60 mL/min
Glucose, Bld: 105 mg/dL — ABNORMAL HIGH (ref 70–99)
Potassium: 4.1 mmol/L (ref 3.5–5.1)
Sodium: 134 mmol/L — ABNORMAL LOW (ref 135–145)
Total Bilirubin: 0.4 mg/dL (ref 0.0–1.2)
Total Protein: 7.1 g/dL (ref 6.5–8.1)

## 2024-09-06 LAB — CBC
HCT: 40.1 % (ref 36.0–46.0)
Hemoglobin: 14.2 g/dL (ref 12.0–15.0)
MCH: 31.1 pg (ref 26.0–34.0)
MCHC: 35.4 g/dL (ref 30.0–36.0)
MCV: 87.9 fL (ref 80.0–100.0)
Platelets: 306 K/uL (ref 150–400)
RBC: 4.56 MIL/uL (ref 3.87–5.11)
RDW: 13.2 % (ref 11.5–15.5)
WBC: 6.9 K/uL (ref 4.0–10.5)
nRBC: 0 % (ref 0.0–0.2)

## 2024-09-06 LAB — LIPASE, BLOOD: Lipase: 27 U/L (ref 11–51)

## 2024-09-06 LAB — OCCULT BLOOD X 1 CARD TO LAB, STOOL: Fecal Occult Bld: NEGATIVE

## 2024-09-06 MED ORDER — SODIUM CHLORIDE 0.9 % IV BOLUS
1000.0000 mL | Freq: Once | INTRAVENOUS | Status: AC
  Administered 2024-09-06: 1000 mL via INTRAVENOUS

## 2024-09-06 MED ORDER — IOHEXOL 300 MG/ML  SOLN
100.0000 mL | Freq: Once | INTRAMUSCULAR | Status: AC | PRN
  Administered 2024-09-06: 100 mL via INTRAVENOUS

## 2024-09-06 MED ORDER — LIDOCAINE VISCOUS HCL 2 % MT SOLN
15.0000 mL | Freq: Once | OROMUCOSAL | Status: AC
  Administered 2024-09-06: 15 mL via ORAL
  Filled 2024-09-06: qty 15

## 2024-09-06 MED ORDER — PANTOPRAZOLE SODIUM 40 MG IV SOLR
40.0000 mg | Freq: Once | INTRAVENOUS | Status: AC
  Administered 2024-09-06: 40 mg via INTRAVENOUS
  Filled 2024-09-06: qty 10

## 2024-09-06 MED ORDER — ALUM & MAG HYDROXIDE-SIMETH 200-200-20 MG/5ML PO SUSP
30.0000 mL | Freq: Once | ORAL | Status: AC
  Administered 2024-09-06: 30 mL via ORAL
  Filled 2024-09-06: qty 30

## 2024-09-06 MED ORDER — PANTOPRAZOLE SODIUM 20 MG PO TBEC: 20.0000 mg | DELAYED_RELEASE_TABLET | Freq: Every day | ORAL | 0 refills | Status: AC

## 2024-09-06 NOTE — ED Provider Notes (Signed)
 " [place] EMERGENCY DEPARTMENT AT [facility] Provider Note   CSN: [id number] Arrival date & time: 09/06/24  1527     Patient presents with: Abdominal Pain   Kaitlyn Bentley is a 64 y.o. female.  64 year old female presents emergency department with complaints of diarrhea and abdominal pain with associated tarry stools since January 8.  Patient reports she had her first episode of diarrhea on January 8 while she was at work and she had the episode of incontinence because she cannot make it to the bathroom.  Since then she has tried Imodium which helped a little bit but the diarrhea came back.  She advises she has had some tarry stool with her diarrhea as well and she has an established GI appointment on Tuesday of next week.  The diarrhea has been fairly consistent since 1/8 with associated epigastric abdominal pain.  She has taken Tums with some relief but now the Tums no longer helped.  She is already on daily Prilosec as well.  She went to the walk-in GI clinic today and they advised that she had positive Hemoccult in office and advised her to go to the emergency department for further evaluation and imaging.  Patient denies any saddle paresthesias or incontinence since January 8.  She denies any bright red blood noted.  She does report the pain in her stomach is getting worse over the month. Patient denies chest pain, SHOB, dizziness, or visual disturbances.      Prior to Admission medications  Medication Sig Start Date End Date Taking? Authorizing Provider  pantoprazole  (PROTONIX ) 20 MG tablet Take 1 tablet (20 mg total) by mouth daily. 09/06/24  Yes Myriam Fonda RAMAN, PA-C  lamoTRIgine (LAMICTAL) 150 MG tablet Take 150 mg by mouth 2 (two) times daily.    [provider]  lithium carbonate 150 MG capsule Take 150 mg by mouth once.    [provider]  Lurasidone HCl (LATUDA PO) Take by mouth.    [provider]    Allergies: Codeine    Review of  Systems  Gastrointestinal:  Positive for abdominal pain and diarrhea.  All other systems reviewed and are negative.   Updated Vital Signs BP 136/70 (BP Location: Right Arm)   Pulse 78   Temp 98.9 F (37.2 C) (Oral)   Resp 16   Ht 5' 6 (1.676 m)   Wt 78.5 kg   LMP 06/25/2013   SpO2 96%   BMI 27.92 kg/m   Physical Exam Vitals and nursing note reviewed.  Constitutional:      General: She is not in acute distress.    Appearance: Normal appearance. She is well-developed. She is not ill-appearing.  HENT:     Head: Normocephalic and atraumatic.     Nose: Nose normal.  Eyes:     Extraocular Movements: Extraocular movements intact.     Conjunctiva/sclera: Conjunctivae normal.     Pupils: Pupils are equal, round, and reactive to light.  Cardiovascular:     Rate and Rhythm: Normal rate.  Pulmonary:     Effort: Pulmonary effort is normal. No respiratory distress.     Breath sounds: Normal breath sounds.  Abdominal:     General: Abdomen is flat. Bowel sounds are normal. There is no distension.     Palpations: Abdomen is soft.     Tenderness: There is abdominal tenderness in the epigastric area and suprapubic area.  Genitourinary:    Comments: Nurse was present for entire rectal exam.  No  gross blood noted in rectal vault or around the rectum.  No loose stool noted in the rectal vault either.  No hemorrhoids.  Good rectal tone, and no pain within the rectal vault. Musculoskeletal:        General: Normal range of motion.     Cervical back: Normal range of motion.  Skin:    General: Skin is warm.     Capillary Refill: Capillary refill takes less than 2 seconds.     Coloration: Skin is not cyanotic or jaundiced.  Neurological:     General: No focal deficit present.     Mental Status: She is alert.  Psychiatric:        Mood and Affect: Mood normal.        Behavior: Behavior normal.     (all labs ordered are listed, but only abnormal results are displayed) Labs Reviewed   COMPREHENSIVE METABOLIC PANEL WITH GFR - Abnormal; Notable for the following components:      Result Value   Sodium 134 (*)    Glucose, Bld 105 (*)    Calcium 10.5 (*)    All other components within normal limits  URINALYSIS, ROUTINE W REFLEX MICROSCOPIC - Abnormal; Notable for the following components:   Color, Urine COLORLESS (*)    Specific Gravity, Urine <1.005 (*)    All other components within normal limits  LIPASE, BLOOD  CBC  OCCULT BLOOD X 1 CARD TO LAB, STOOL    EKG: EKG Interpretation Date/Time:  Wednesday September 06 2024 15:41:40 EST Ventricular Rate:  74 PR Interval:  132 QRS Duration:  84 QT Interval:  352 QTC Calculation: 390 R Axis:   76  Text Interpretation: Normal sinus rhythm Normal ECG When compared with ECG of 25-Aug-2013 09:02, PREVIOUS ECG IS PRESENT Confirmed by Pamella Sharper [phone]) on 09/06/2024 8:01:02 PM  Radiology: CT ABDOMEN PELVIS W CONTRAST Result Date: 09/06/2024 CLINICAL DATA:  Abdominal pain. EXAM: CT ABDOMEN AND PELVIS WITH CONTRAST TECHNIQUE: Multidetector CT imaging of the abdomen and pelvis was performed using the standard protocol following bolus administration of intravenous contrast. RADIATION DOSE REDUCTION: This exam was performed according to the departmental dose-optimization program which includes automated exposure control, adjustment of the mA and/or kV according to patient size and/or use of iterative reconstruction technique. CONTRAST:  OMNIPAQUE  IOHEXOL  300 MG/ML  SOLN COMPARISON:  CT abdomen pelvis dated 01/03/2019. FINDINGS: Lower chest: The visualized lung bases are clear. No intra-abdominal free air or free fluid. Hepatobiliary: The liver is unremarkable. No biliary dilatation. The gallbladder is unremarkable. Pancreas: Unremarkable. No pancreatic ductal dilatation or surrounding inflammatory changes. Spleen: Normal in size without focal abnormality. Adrenals/Urinary Tract: The adrenal glands are unremarkable. The kidneys,  visualized ureters, and urinary bladder appear unremarkable. Stomach/Bowel: There is mild sigmoid diverticulosis and scattered colonic diverticula. There is no bowel obstruction or active inflammation. The appendix is normal. Vascular/Lymphatic: Mild aortoiliac atherosclerotic disease. The IVC is unremarkable. No portal venous gas. There is no adenopathy. Reproductive: The uterus is grossly appropriate no suspicious adnexal masses. Other: None Musculoskeletal: Degenerative changes of the spine. L2 superior endplate Schmorl's node. No acute osseous pathology. IMPRESSION: 1. No acute intra-abdominal or pelvic pathology. 2. Colonic diverticulosis. No bowel obstruction. Normal appendix. 3.  Aortic Atherosclerosis (ICD10-I70.0). Electronically Signed   By: Vanetta Chou M.D.   On: 09/06/2024 18:21     Procedures   Medications Ordered in the ED  sodium chloride  0.9 % bolus 1,000 mL ( Intravenous Stopped 09/06/24 1926)  pantoprazole  (PROTONIX ) injection  40 mg (40 mg Intravenous Given 09/06/24 1818)  alum & mag hydroxide-simeth (MAALOX/MYLANTA) 200-200-20 MG/5ML suspension 30 mL (30 mLs Oral Given 09/06/24 1820)    And  lidocaine  (XYLOCAINE ) 2 % viscous mouth solution 15 mL (15 mLs Oral Given 09/06/24 1820)  iohexol  (OMNIPAQUE ) 300 MG/ML solution 100 mL (100 mLs Intravenous Contrast Given 09/06/24 1801)    64 y.o. female presents to the ED with complaints of abdominal pain and diarrhea and dark tarry stool, The differential diagnosis includes but not limited to gastric ulcer, GERD, appendicitis, cholecystitis, appendicitis, cauda equina (Ddx)   On arrival pt is nontoxic, vitals unremarkable. Exam significant for pain in epigastric and suprapubic area.   I ordered medication pantoprazole  GI cocktail for acid reflux.  Lab Tests:  CMP CBC UA lipase ordered in triage No significant abnormalities on lab work.  Imaging Studies ordered:  I ordered imaging studies which included CT abdomen pelvis with  contrast which showed no acute pelvic or intra-abdominal pathology.  Colonic diverticulosis without diverticulitis, no bowel obstruction, normal appendix but did note aortic arthrosclerosis.  ED Course:   Patient sitting comfortably in ED bed in no acute distress nontoxic-appearing on initial evaluation.  Patient is able to walk in the ED without assistance or difficulty.  Lab work is overall benign.  Patient does have some suprapubic and epigastric tenderness CT scan will be ordered at this time.  Patient will be given GI cocktail and Protonix  because she did note Tums has not helped previously.  On reevaluation CT abdomen pelvis was unremarkable and patient was reporting some relief with medication.  Rectal exam was overall unremarkable and hemoglobin is stable.  No active bleeding noted.  Patient had good rectal tone.  Cauda equina was considered but patient has not had any episodes of incontinence since January 8 and no saddle paresthesias.  Patient reports feeling much better after medications given in the ED.  Patient will be given Protonix  and she already has an established follow-up with GI on Tuesday.  Patient was advised of strict return precautions, agreed with treatment plan, and was comfortable discharge.    Portions of this note were generated with Scientist, clinical (histocompatibility and immunogenetics). Dictation errors may occur despite best attempts at proofreading.   Final diagnoses:  Diarrhea, unspecified type  Gastroesophageal reflux disease, unspecified whether esophagitis present    ED Discharge Orders          Ordered    pantoprazole  (PROTONIX ) 20 MG tablet  Daily        09/06/24 2003               Myriam Fonda RAMAN, PA-C 09/07/24 0018    Pamella Ozell LABOR, DO 09/12/24 [id number]  "

## 2024-09-06 NOTE — Discharge Instructions (Signed)
 Imaging lab work and exam were overall reassuring today.  I prescribed you Protonix  please take it once daily.  It is recommended that you follow-up with your established GI appointment on Tuesday for further evaluation and recommendation.  You may also take Pepcid as needed with GERD symptoms.  Please monitor for any worsening symptoms, if you experience anything concerning please return to the emergency department for further evaluation.  Symptoms to monitor for would be chest pain, shortness of breath, large amounts of bright red blood in stool or vomit.  If any of these or other concerning symptoms arise please return emergency department for further evaluation.

## 2024-09-06 NOTE — ED Triage Notes (Signed)
 Pt caox4 ambulatory NAD c/o diarrhea, multiple episodes of fecal incontinence, dark tarry stool, and upper abd pain since 1/8. Pt from [employer] walk-in stating she had +hemoccult there.

## 2024-09-06 NOTE — ED Notes (Signed)
 Patient transported to CT
